# Patient Record
Sex: Female | Born: 1960 | Race: White | Hispanic: No | Marital: Single
Health system: Midwestern US, Community
[De-identification: ages and names within clinical notes are randomized; demographics above are authoritative.]

## PROBLEM LIST (undated history)

## (undated) DIAGNOSIS — Z1231 Encounter for screening mammogram for malignant neoplasm of breast: Secondary | ICD-10-CM

## (undated) DIAGNOSIS — R011 Cardiac murmur, unspecified: Secondary | ICD-10-CM

## (undated) DIAGNOSIS — F32A Depression, unspecified: Secondary | ICD-10-CM

## (undated) DIAGNOSIS — C8290 Follicular lymphoma, unspecified, unspecified site: Secondary | ICD-10-CM

## (undated) DIAGNOSIS — G8929 Other chronic pain: Secondary | ICD-10-CM

## (undated) DIAGNOSIS — F419 Anxiety disorder, unspecified: Secondary | ICD-10-CM

## (undated) DIAGNOSIS — M8700 Idiopathic aseptic necrosis of unspecified bone: Secondary | ICD-10-CM

## (undated) DIAGNOSIS — M1990 Unspecified osteoarthritis, unspecified site: Secondary | ICD-10-CM

## (undated) DIAGNOSIS — R112 Nausea with vomiting, unspecified: Secondary | ICD-10-CM

## (undated) DIAGNOSIS — Z9289 Personal history of other medical treatment: Secondary | ICD-10-CM

## (undated) DIAGNOSIS — K219 Gastro-esophageal reflux disease without esophagitis: Secondary | ICD-10-CM

## (undated) DIAGNOSIS — M549 Dorsalgia, unspecified: Secondary | ICD-10-CM

## (undated) DIAGNOSIS — Z9889 Other specified postprocedural states: Secondary | ICD-10-CM

## (undated) DIAGNOSIS — S82209A Unspecified fracture of shaft of unspecified tibia, initial encounter for closed fracture: Secondary | ICD-10-CM

## (undated) DIAGNOSIS — I10 Essential (primary) hypertension: Secondary | ICD-10-CM

## (undated) DIAGNOSIS — F329 Major depressive disorder, single episode, unspecified: Secondary | ICD-10-CM

## (undated) DIAGNOSIS — Z1239 Encounter for other screening for malignant neoplasm of breast: Secondary | ICD-10-CM

## (undated) DIAGNOSIS — R928 Other abnormal and inconclusive findings on diagnostic imaging of breast: Secondary | ICD-10-CM

## (undated) HISTORY — PX: LAPAROSCOPIC CHOLECYSTECTOMY: SUR755

## (undated) HISTORY — PX: LUMBAR DISC SURGERY: SHX700

## (undated) HISTORY — PX: ABDOMINAL HYSTERECTOMY: SHX81

## (undated) HISTORY — PX: TUBAL LIGATION: SHX77

## (undated) HISTORY — PX: FRACTURE SURGERY: SHX138

## (undated) HISTORY — PX: WISDOM TOOTH EXTRACTION: SHX21

## (undated) HISTORY — PX: COLONOSCOPY: SHX174

## (undated) HISTORY — PX: BACK SURGERY: SHX140

## (undated) HISTORY — PX: DILATION AND CURETTAGE OF UTERUS: SHX78

## (undated) HISTORY — PX: POSTERIOR LUMBAR FUSION: SHX6036

## (undated) HISTORY — PX: SPINAL CORD STIMULATOR IMPLANT: SHX2422

---

## 1998-09-27 HISTORY — PX: ANTERIOR CERVICAL DECOMP/DISCECTOMY FUSION: SHX1161

## 2009-04-18 LAB — HM MAMMOGRAPHY: Mammography, External: NORMAL

## 2011-01-20 LAB — HM PAP SMEAR: Pap Smear, External: NORMAL

## 2012-04-24 LAB — AMB POC URINALYSIS DIP STICK MANUAL W/O MICRO
Glucose (UA POC): NEGATIVE
Nitrites (UA POC): NEGATIVE
Protein (UA POC): NEGATIVE mg/dL

## 2012-04-24 MED ORDER — NITROFURANTOIN (25% MACROCRYSTAL FORM) 100 MG CAP
100 mg | ORAL_CAPSULE | Freq: Two times a day (BID) | ORAL | Status: AC
Start: 2012-04-24 — End: 2012-05-01

## 2012-04-24 NOTE — Progress Notes (Signed)
Subjective:   51 y.o. female for annual routine Pap and checkup.  Patient's last menstrual period was 03/27/2012.    Social History: not sexually active.  Pertinent past medical hstory: no history of  DVT, CAD, DM, liver disease, migraines or smoking.    Patient Active Problem List   Diagnoses Code   ??? UTI (lower urinary tract infection) 599.0   ??? Routine gynecological examination V72.31   ??? Bulky or enlarged uterus 621.2     Patient Active Problem List   Diagnoses Date Noted   ??? UTI (lower urinary tract infection) 04/24/2012   ??? Routine gynecological examination 04/24/2012   ??? Bulky or enlarged uterus 04/24/2012     Current Outpatient Prescriptions   Medication Sig Dispense Refill   ??? multivitamin with iron (DAILY MULTI-VITAMINS/IRON) tablet Take 1 Tab by mouth daily.       ??? nitrofurantoin, macrocrystal-monohydrate, (MACROBID) 100 mg capsule Take 1 Cap by mouth two (2) times a day for 7 days.  14 Cap  0     Allergies   Allergen Reactions   ??? Sulfa (Sulfonamide Antibiotics) Unable to Obtain     Past Medical History   Diagnosis Date   ??? Hypertension    ??? Lethargy    ??? Fibrocystic breast    ??? Endocervical polyp 2001     Colposcopy/ Bx. benign   ??? Incontinence      Mild   ??? Enlarged uterus      History reviewed. No pertinent past surgical history.  Family History   Problem Relation Age of Onset   ??? Psychiatric Disorder Mother      alzheimer   ??? Depression Mother    ??? Thyroid Disease Mother    ??? Other Mother      hiatal hernia   ??? Cancer Father      Brain   ??? Heart Disease Maternal Uncle    ??? Heart Disease Maternal Grandfather    ??? Other Paternal Grandmother      gall bladder    ??? Alcohol abuse Neg Hx    ??? Arthritis-osteo Neg Hx    ??? Asthma Neg Hx    ??? Bleeding Prob Neg Hx    ??? Diabetes Neg Hx    ??? Elevated Lipids Neg Hx    ??? Headache Neg Hx    ??? Hypertension Neg Hx    ??? Lung Disease Neg Hx    ??? Migraines Neg Hx    ??? Stroke Neg Hx    ??? Mental Retardation Neg Hx      History   Substance Use Topics   ??? Smoking status:  Never Smoker    ??? Smokeless tobacco: Never Used   ??? Alcohol Use: 0.5 oz/week     1 drink(s) per week      Occas.         ROS:  Feeling well. No dyspnea or chest pain on exertion.  No abdominal pain, change in bowel habits, black or bloody stools.  No urinary tract symptoms. GYN ROS: normal menses, no abnormal bleeding, pelvic pain or discharge, no breast pain or new or enlarging lumps on self exam. No neurological complaints.    Objective:   BP 135/71   Pulse 74   Ht 5\' 5"  (1.651 m)   Wt 177 lb (80.287 kg)   BMI 29.45 kg/m2   LMP 03/27/2012  The patient appears well, alert, oriented x 3, in no distress.  ENT normal.  Neck supple. No adenopathy  or thyromegaly. PERLA. Lungs are clear, good air entry, no wheezes, rhonchi or rales. S1 and S2 normal, no murmurs, regular rate and rhythm. Abdomen soft without tenderness, guarding, mass or organomegaly. Extremities show no edema, normal peripheral pulses. Neurological is normal, no focal findings.    BREAST EXAM: breasts appear normal, no suspicious masses, no skin or nipple changes or axillary nodes    PELVIC EXAM: VULVA: normal appearing vulva with no masses, tenderness or lesions, VAGINA: normal appearing vagina with normal color and discharge, no lesions, CERVIX: normal appearing cervix without discharge or lesions, cervical motion tenderness present, UTERUS: enlarged to 12 week's size, irregular, ADNEXA: normal adnexa in size, nontender and no masses, RECTAL: normal rectal, no masses, PAP: Pap smear done today    Assessment/Plan:   well woman exam completed  mammogram  pap smear  additional lab tests per orders  1. Routine gynecological examination  AMB POC URINALYSIS DIP STICK MANUAL W/O MICRO, PAP LB, RFX HPV ALL AVW([id number])   2. UTI (lower urinary tract infection)  nitrofurantoin, macrocrystal-monohydrate, (MACROBID) 100 mg capsule     Encounter Diagnoses   Name Primary?   ??? Routine gynecological examination Yes   ??? UTI (lower urinary tract infection)      Orders  Placed This Encounter   ??? AMB POC URINALYSIS DIP STICK MANUAL W/O MICRO   ??? multivitamin with iron (DAILY MULTI-VITAMINS/IRON) tablet   ??? nitrofurantoin, macrocrystal-monohydrate, (MACROBID) 100 mg capsule   ??? PAP LB, RFX HPV ALL JYN([id number])   .

## 2012-04-24 NOTE — Patient Instructions (Signed)
Uterine Fibroids: After Your Visit  Your Care Instructions  Uterine fibroids are growths in the uterus. Fibroids aren't cancer. Doctors don't know what causes fibroids. Fibroids are very common in women during their childbearing years.  Fibroids can grow on the inside of the uterus, in the muscle wall of the uterus, or near the outside wall of the uterus. In some women, fibroids cause painful cramps and heavy periods. In these cases, taking anti-inflammatory medicines and birth control pills often helps decrease symptoms. Sometimes surgery is needed to treat fibroids. But if you are near menopause, you may want to wait and see if your symptoms get better.  Most fibroids shrink and go away after menopause, when your menstrual periods stop completely.  Follow-up care is a key part of your treatment and safety. Be sure to make and go to all appointments, and call your doctor if you are having problems. It???s also a good idea to know your test results and keep a list of the medicines you take.  How can you care for yourself at home?  ?? If your doctor gave you medicine, take it as exactly as prescribed. Call your doctor if you think you are having a problem with your medicine.   ?? Take anti-inflammatory medicines for pain. These include ibuprofen (Advil, Motrin) and naproxen (Aleve). Read and follow all instructions on the label.   ?? Use heat, such as a hot water bottle or a heating pad set on low, or a warm bath to relax tense muscles and relieve cramping. Put a thin cloth between the heating pad and your skin. Never go to sleep with a heating pad on.   ?? Lie down and put a pillow under your knees. Or, lie on your side and bring your knees up to your chest. These positions may help relieve belly pain or pressure.   ?? Use sanitary pads instead of tampons. Keep track of how many you use each day.   ?? Get at least 30 minutes of exercise on most days of the week. Walking is a good choice. You also may want to do other  activities, such as running, swimming, cycling, or playing tennis or team sports.   ?? If you bleed for several days or have heavy bleeding, take a daily multivitamin with iron.   When should you call for help?  Call 911 anytime you think you may need emergency care. For example, call if:  ?? You passed out (lost consciousness).   ?? You have sudden, severe pain in your belly or pelvis.   Call your doctor now or seek immediate medical care if:  ?? You have severe vaginal bleeding. You are passing clots of blood and soaking through your usual pads every hour for 2 or more hours.   ?? You have new belly or pelvic pain.   ?? You are dizzy or lightheaded, or you feel like you may faint.   ?? You have new or unexpected vaginal bleeding.   Watch closely for changes in your health, and be sure to contact your doctor if:  ?? You have new vaginal discharge.   ?? You have ongoing heavy or irregular vaginal bleeding.   ?? You have pelvic pain or a heavy feeling in your lower belly that doesn't go away.   ?? You have to urinate often.   ?? You are more constipated than usual.     Where can you learn more?    Go to http://www.healthwise.net/[facility]     Enter B121 in the search box to learn more about "Uterine Fibroids: After Your Visit."    ?? 2006-2013 Healthwise, Incorporated. Care instructions adapted under license by [place] (which disclaims liability or warranty for this information). This care instruction is for use with your licensed healthcare professional. If you have questions about a medical condition or this instruction, always ask your healthcare professional. Healthwise, Incorporated disclaims any warranty or liability for your use of this information.  Content Version: 9.7.130178; Last Revised: April 01, 2010

## 2012-04-29 LAB — PAP LB, RFX HPV ALL PTH(507301)
.: 0
LABCORP 019018: 0

## 2012-04-29 LAB — HPV HIGH RISK, THIN PREP
HPV DNA Probe, High Risk: POSITIVE — AB
HPV, High Risk: POSITIVE — AB

## 2012-05-01 NOTE — Progress Notes (Signed)
Quick Note:    Pt needs to be asked to come in for discussion of results.  ______

## 2012-05-02 NOTE — Progress Notes (Signed)
Pt has appt on 05/10/12.  Dr will discuss results with her at this time

## 2012-05-09 ENCOUNTER — Encounter

## 2012-05-10 NOTE — Progress Notes (Signed)
Subjective:      Pamela Hale is a 51 y.o. female seen for evaluation of abnormal Pap smear.    Most recent Pap smear 2 weeks ago result: Mildly abnormal (LGSIL - encompassing HPV,mild dysplasia,CIN I).  Prior Pap result: Normal.  Prior cervical/vaginal disease: CIN 1.   Prior cervical treatment: no treatment.    Was noted to have an enlarged bulky uterus at last exam. Fibroid was confirmed by u/s exam subsequently.    Marital status: single  Sexual history: not sexually active.  Cervical cancer risk factors: prior abnormal Pap smears    Patient Active Problem List   Diagnoses Code   ??? UTI (lower urinary tract infection) 599.0   ??? Bulky or enlarged uterus 621.2   ??? 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 LSIL (low grade squamous intraepithelial lesion) on Pap smear 05/10/2012   ??? UTI (lower urinary tract infection) 04/24/2012   ??? Bulky or enlarged uterus 04/24/2012     Current Outpatient Prescriptions   Medication Sig Dispense Refill   ??? multivitamin with iron (DAILY MULTI-VITAMINS/IRON) tablet Take 1 Tab by mouth daily.         Allergies   Allergen Reactions   ??? Sulfa (Sulfonamide Antibiotics) Unable to Obtain     Past Medical History   Diagnosis Date   ??? Lethargy    ??? Fibrocystic breast    ??? Endocervical polyp 2001     Colposcopy/ Bx. benign   ??? Incontinence      Mild   ??? Enlarged uterus    ??? Hypertension      History reviewed. No pertinent past surgical history.  Family History   Problem Relation Age of Onset   ??? Psychiatric Disorder Mother      alzheimer   ??? Depression Mother    ??? Thyroid Disease Mother    ??? Other Mother      hiatal hernia   ??? Cancer Father      Brain   ??? Heart Disease Maternal Uncle    ??? Heart Disease Maternal Grandfather    ??? Other Paternal Grandmother      gall bladder    ??? Alcohol abuse Neg Hx    ??? Arthritis-osteo Neg Hx    ??? Asthma Neg Hx    ??? Bleeding Prob Neg Hx    ??? Diabetes Neg Hx    ??? Elevated  Lipids Neg Hx    ??? Headache Neg Hx    ??? Hypertension Neg Hx    ??? Lung Disease Neg Hx    ??? Migraines Neg Hx    ??? Stroke Neg Hx    ??? Mental Retardation Neg Hx      History   Substance Use Topics   ??? Smoking status: Never Smoker    ??? Smokeless tobacco: Never Used   ??? Alcohol Use: 0.5 oz/week     1 drink(s) per week      Occas.       Review of Systems    Pertinent items are noted in HPI.     Objective:     BP 151/79   Pulse 82   Ht 5\' 5"  (1.651 m)   Wt 176 lb (79.833 kg)   BMI 29.29 kg/m2   LMP 04/25/2012   Physical Exam:   General appearance - alert, well appearing, and in no distress  Mental status -  alert, oriented to person, place, and time, normal mood, behavior, speech, dress, motor activity, and thought processes  Neurological - alert, oriented, normal speech, no focal findings or movement disorder noted     Abnormal pap result: was explained to pt at length today. Need for colposcopy/Bx stressed.    95/100  4/100        1/100      / Less than 1/100 Less than  Normal ASCUS   Low Grade / High Grade    09/998             LSIL    HSIL      Normal  Atypical   Squamous   Cancer  Pap Smear Squamous  Intraepithelial    Cells of  Lesion    Undetermined    Significance      Ultrasound report: was also explained to pt. She has had problems with prolonged and heavy periods. Options discussed with pt today.    Assessment/Plan:     LSIL pap                           Colposcopy will be scheduled as soon as possible    Enlarged / Fibroid uterus: Evaluation and treatment options discussed with pt.

## 2012-05-10 NOTE — Patient Instructions (Signed)
Colposcopy: Before Your Procedure  What is a colposcopy?  Colposcopy is a way for your doctor to look closely at your vulva, vagina, and cervix to look for any problems. If the doctor sees an area of concern, he or she can take a small sample of tissue. This is called a biopsy. The sample is looked at under a microscope.  Colposcopy is usually done when the result of a Pap test is abnormal.  During the test, your doctor will insert a lubricated tool called a speculum into your vagina. The speculum gently spreads apart the sides of your vagina. This allows your doctor to see inside your vagina and the cervix. Then the doctor will use a magnifying device to see into your vagina and cervix. The device does not go inside your vagina.  The doctor may put a vinegar or iodine solution on your cervix to make abnormal areas more visible. He or she may take photos or videos of your vagina and cervix.  You may feel some discomfort when the speculum is inserted. You may feel a pinch and have some cramping if the doctor takes a biopsy sample.  Follow-up care is a key part of your treatment and safety. Be sure to make and go to all appointments, and call your doctor if you are having problems. It's also a good idea to know your test results and keep a list of the medicines you take.  What happens before the procedure?  Having a procedure can be stressful. This information will help you understand what you can expect and how to safely prepare for your procedure.  Preparing for the procedure  ?? Tell your doctor if:   ?? You are having your menstrual period. This test usually is not done during your period, because blood cells make it harder for your doctor to see your cervix.   ?? You are or might be pregnant. A blood or urine test may be done to see if you are pregnant. Colposcopy is safe during pregnancy. The chance of miscarriage is very small. But you may have some bleeding from a biopsy.   ?? You are allergic to any medicines.    ?? You have had bleeding problems or take blood thinners, such as warfarin (Coumadin), clopidogrel (Plavix), or aspirin.   ?? You have been treated for a vaginal, cervical, or pelvic infection.   ?? Do not douche, use tampons, have sexual intercourse, or use vaginal medicines for 24 hours before the test.   ?? Bring a list of questions to ask your doctors. It is important that you understand exactly what procedure is planned, the risks, benefits, and other options before your procedure.   ?? Tell your doctors ALL the medicines, vitamins, supplements, or herbal remedies you are taking. Keep a list of these with you, and bring this with you to every appointment. You will be told which medicine to take or to stop before your procedure.   Taking care of yourself before the procedure  ?? Build healthy habits into your life. Changes are best made several weeks before the procedure, since your body may react to sudden changes in your habits.   ?? Stay as active as you can.   ?? Eat a healthy diet.   ?? Cut back or quit alcohol and tobacco.   What happens on the day of the procedure?  ?? You may want to take a pain reliever, such as ibuprofen (Advil or Motrin), 30 to 60 minutes before   you have the test. This can help reduce any cramping pain from a biopsy.   ?? Leave your valuables at home.   At the doctor's office  ?? Bring a picture ID.   ?? The procedure will last about 15 to 30 minutes.   ?? Afterward, you may be given a pad in case you have any bleeding.   Going home  ?? You will be given more specific instructions about recovering from your procedure, including activity and when you may return to work.   When should you call your doctor?  ?? You have questions or concerns.   ?? You don't understand how to prepare for your procedure.   ?? You become ill before the procedure (such as fever, flu, or a cold).   ?? You need to reschedule or have changed your mind about having the procedure.     Where can you learn more?    Go to  http://www.healthwise.net/[facility]   Enter N867 in the search box to learn more about "Colposcopy: Before Your Procedure."    ?? 2006-2013 Healthwise, Incorporated. Care instructions adapted under license by [place] (which disclaims liability or warranty for this information). This care instruction is for use with your licensed healthcare professional. If you have questions about a medical condition or this instruction, always ask your healthcare professional. Healthwise, Incorporated disclaims any warranty or liability for your use of this information.  Content Version: 9.7.130178; Last Revised: July 19, 2011

## 2012-05-17 NOTE — Progress Notes (Signed)
Patient here for a colposcopic exam because of LSIL pap smear.  Procedure has been explained and discussed with patient at length using diagrams and models.   Potential risks and complications of the procedure have been conveyed to the patient verbally and in writing.   Patient's questions have been answered to her satisfaction and a consent for the procedure obtained.    Procedure:    Patient asked to assume a lithotomy position. Speculum inserted and the cervix visualized.     Colposcopic exam carried out before and after application of acetic acid.   Transformation zone is not visible.  Cervix is  not friable   White epithelium seen at 12 O'Clock on the portio vaginalis of the cervix - biopsied  Mosaicism  not seen   Punctation  not seen   Abnormal blood vessels  not seen    ECC  obtained  Biopsies taken    Bleeding secured with silver nitrate sticks    A: Unsatisfactory colposcopic exam       P: ECC  obtained   Biopsies taken from the portio vaginalis of the cervix

## 2012-05-17 NOTE — Patient Instructions (Signed)
Colposcopy: Before Your Procedure  What is a colposcopy?  Colposcopy is a way for your doctor to look closely at your vulva, vagina, and cervix to look for any problems. If the doctor sees an area of concern, he or she can take a small sample of tissue. This is called a biopsy. The sample is looked at under a microscope.  Colposcopy is usually done when the result of a Pap test is abnormal.  During the test, your doctor will insert a lubricated tool called a speculum into your vagina. The speculum gently spreads apart the sides of your vagina. This allows your doctor to see inside your vagina and the cervix. Then the doctor will use a magnifying device to see into your vagina and cervix. The device does not go inside your vagina.  The doctor may put a vinegar or iodine solution on your cervix to make abnormal areas more visible. He or she may take photos or videos of your vagina and cervix.  You may feel some discomfort when the speculum is inserted. You may feel a pinch and have some cramping if the doctor takes a biopsy sample.  Follow-up care is a key part of your treatment and safety. Be sure to make and go to all appointments, and call your doctor if you are having problems. It's also a good idea to know your test results and keep a list of the medicines you take.  What happens before the procedure?  Having a procedure can be stressful. This information will help you understand what you can expect and how to safely prepare for your procedure.  Preparing for the procedure  ?? Tell your doctor if:   ?? You are having your menstrual period. This test usually is not done during your period, because blood cells make it harder for your doctor to see your cervix.   ?? You are or might be pregnant. A blood or urine test may be done to see if you are pregnant. Colposcopy is safe during pregnancy. The chance of miscarriage is very small. But you may have some bleeding from a biopsy.   ?? You are allergic to any medicines.    ?? You have had bleeding problems or take blood thinners, such as warfarin (Coumadin), clopidogrel (Plavix), or aspirin.   ?? You have been treated for a vaginal, cervical, or pelvic infection.   ?? Do not douche, use tampons, have sexual intercourse, or use vaginal medicines for 24 hours before the test.   ?? Bring a list of questions to ask your doctors. It is important that you understand exactly what procedure is planned, the risks, benefits, and other options before your procedure.   ?? Tell your doctors ALL the medicines, vitamins, supplements, or herbal remedies you are taking. Keep a list of these with you, and bring this with you to every appointment. You will be told which medicine to take or to stop before your procedure.   Taking care of yourself before the procedure  ?? Build healthy habits into your life. Changes are best made several weeks before the procedure, since your body may react to sudden changes in your habits.   ?? Stay as active as you can.   ?? Eat a healthy diet.   ?? Cut back or quit alcohol and tobacco.   What happens on the day of the procedure?  ?? You may want to take a pain reliever, such as ibuprofen (Advil or Motrin), 30 to 60 minutes before   you have the test. This can help reduce any cramping pain from a biopsy.   ?? Leave your valuables at home.   At the doctor's office  ?? Bring a picture ID.   ?? The procedure will last about 15 to 30 minutes.   ?? Afterward, you may be given a pad in case you have any bleeding.   Going home  ?? You will be given more specific instructions about recovering from your procedure, including activity and when you may return to work.   When should you call your doctor?  ?? You have questions or concerns.   ?? You don't understand how to prepare for your procedure.   ?? You become ill before the procedure (such as fever, flu, or a cold).   ?? You need to reschedule or have changed your mind about having the procedure.     Where can you learn more?    Go to  http://www.healthwise.net/[facility]   Enter N867 in the search box to learn more about "Colposcopy: Before Your Procedure."    ?? 2006-2013 Healthwise, Incorporated. Care instructions adapted under license by [place] (which disclaims liability or warranty for this information). This care instruction is for use with your licensed healthcare professional. If you have questions about a medical condition or this instruction, always ask your healthcare professional. Health[employer], Inc[employer] disclaims any warranty or liability for your use of this information.  Content Version: 9.7.130178; Last Revised: July 19, 2011

## 2012-05-19 LAB — HISTOPATHOLOGY

## 2013-06-05 NOTE — Telephone Encounter (Signed)
Pt is due for mmg, unable to reach patient to schedule, # disconnected, pt is also due for gyn exam

## 2013-06-06 ENCOUNTER — Encounter

## 2014-08-26 ENCOUNTER — Encounter

## 2014-11-01 ENCOUNTER — Ambulatory Visit: Payer: BLUE CROSS/BLUE SHIELD | Primary: Family

## 2014-12-20 ENCOUNTER — Inpatient Hospital Stay: Admit: 2014-12-20 | Payer: BLUE CROSS/BLUE SHIELD | Attending: Obstetrics & Gynecology | Primary: Family

## 2014-12-20 DIAGNOSIS — Z1231 Encounter for screening mammogram for malignant neoplasm of breast: Secondary | ICD-10-CM

## 2016-01-20 ENCOUNTER — Encounter

## 2016-03-01 ENCOUNTER — Telehealth: Payer: Self-pay | Admitting: Hematology

## 2016-03-01 NOTE — Telephone Encounter (Signed)
Verified address and insurance, spoke with referring office regarding appt date/time and will contact pt, lt mess for pt regarding new pt referral.

## 2016-03-03 ENCOUNTER — Other Ambulatory Visit: Payer: Medicare HMO

## 2016-03-03 ENCOUNTER — Telehealth: Payer: Self-pay | Admitting: Hematology

## 2016-03-03 ENCOUNTER — Ambulatory Visit (HOSPITAL_BASED_OUTPATIENT_CLINIC_OR_DEPARTMENT_OTHER): Payer: Medicare HMO | Admitting: Hematology

## 2016-03-03 ENCOUNTER — Ambulatory Visit (HOSPITAL_BASED_OUTPATIENT_CLINIC_OR_DEPARTMENT_OTHER): Payer: Medicare HMO

## 2016-03-03 VITALS — BP 152/64 | HR 72 | Temp 98.0°F | Resp 18 | Ht 66.0 in | Wt 182.9 lb

## 2016-03-03 DIAGNOSIS — C8299 Follicular lymphoma, unspecified, extranodal and solid organ sites: Secondary | ICD-10-CM | POA: Diagnosis not present

## 2016-03-03 DIAGNOSIS — I10 Essential (primary) hypertension: Secondary | ICD-10-CM | POA: Diagnosis not present

## 2016-03-03 DIAGNOSIS — C8290 Follicular lymphoma, unspecified, unspecified site: Secondary | ICD-10-CM

## 2016-03-03 LAB — COMPREHENSIVE METABOLIC PANEL
ALBUMIN: 4 g/dL (ref 3.5–5.0)
ALK PHOS: 105 U/L (ref 40–150)
ALT: 25 U/L (ref 0–55)
ANION GAP: 6 meq/L (ref 3–11)
AST: 21 U/L (ref 5–34)
BILIRUBIN TOTAL: 0.36 mg/dL (ref 0.20–1.20)
BUN: 19 mg/dL (ref 7.0–26.0)
CALCIUM: 9.5 mg/dL (ref 8.4–10.4)
CO2: 30 mEq/L — ABNORMAL HIGH (ref 22–29)
Chloride: 105 mEq/L (ref 98–109)
Creatinine: 0.8 mg/dL (ref 0.6–1.1)
EGFR: 80 mL/min/{1.73_m2} — AB (ref >90)
GLUCOSE: 96 mg/dL (ref 70–140)
POTASSIUM: 3.8 meq/L (ref 3.5–5.1)
SODIUM: 140 meq/L (ref 136–145)
TOTAL PROTEIN: 7.4 g/dL (ref 6.4–8.3)

## 2016-03-03 LAB — LACTATE DEHYDROGENASE: LDH: 178 U/L (ref 125–245)

## 2016-03-03 LAB — CBC & DIFF AND RETIC
BASO%: 0.5 % (ref 0.0–2.0)
Basophils Absolute: 0 10*3/uL (ref 0.0–0.1)
EOS ABS: 0.2 10*3/uL (ref 0.0–0.5)
EOS%: 2.9 % (ref 0.0–7.0)
HCT: 38 % (ref 34.8–46.6)
HEMOGLOBIN: 12.8 g/dL (ref 11.6–15.9)
Immature Retic Fract: 7.5 % (ref 1.60–10.00)
LYMPH%: 29.4 % (ref 14.0–49.7)
MCH: 30 pg (ref 25.1–34.0)
MCHC: 33.7 g/dL (ref 31.5–36.0)
MCV: 89 fL (ref 79.5–101.0)
MONO#: 0.4 10*3/uL (ref 0.1–0.9)
MONO%: 7.2 % (ref 0.0–14.0)
NEUT%: 60 % (ref 38.4–76.8)
NEUTROS ABS: 3.7 10*3/uL (ref 1.5–6.5)
PLATELETS: 207 10*3/uL (ref 145–400)
RBC: 4.27 10*6/uL (ref 3.70–5.45)
RDW: 12.8 % (ref 11.2–14.5)
Retic %: 1.51 % (ref 0.70–2.10)
Retic Ct Abs: 64.48 10*3/uL (ref 33.70–90.70)
WBC: 6.1 10*3/uL (ref 3.9–10.3)
lymph#: 1.8 10*3/uL (ref 0.9–3.3)

## 2016-03-03 NOTE — Telephone Encounter (Signed)
per pof to sch pt appt-pt sent back to lab-gave copy of avs

## 2016-03-03 NOTE — Progress Notes (Signed)
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  HISTORY OF PRESENTING ILLNESS:   Priscilla Higgins is a wonderful 55 y.o. female who has been referred to [place] by Dr Emelda Fear, DO  for evaluation and management of newly diagnosed follicular lymphoma.  Patient has a history of hypertension, depression/anxiety, degenerative disc disease with multiple spinal surgeries and chronic pain who reports having had a scaly somewhat pruritic lesion in front of her right ear. She reports this has been present for several months and she eventually saw a dermatologist who biopsied this. Pathology revealed presence of follicular lymphoma.  Patient also reports having a swelling over her left fore head for several months as well. This has not been biopsied. Patient reports perimenopausal symptoms including hot flashes but does not report significant night sweats or weight loss or new fatigue.  She does not reveal any palpable enlarged lymph nodes or other overt new focal symptoms. No shortness of breath or chest pain. No abdominal pain or significant distention.   MEDICAL HISTORY:   #1 anxiety #2 degenerative disc disease with multiple back surgeries and chronic pain  #3 presence of spinal stimulator #4 hypertension #5 depression/anxiety #6 perimenopausal symptoms #7History of cervical cancer status post total abdominal hysterectomy and bilateral salpingo-oophorectomy at age 9 yrs.  SURGICAL HISTORY:  Back surgery 5 times Neck surgery 1 fusion. Dorsal column stimulator. Cholecystectomy Bladder lift 2 History of cervical cancer status post total abdominal hysterectomy and bilateral salpingo-oophorectomy at age 34  SOCIAL HISTORY: Social History   Social History  . Marital Status: Married    Spouse  Name: N/A  . Number of Children: N/A  . Years of Education: N/A   Occupational History  . Not on file.   Social History Main Topics  . Smoking status: Not on file  . Smokeless tobacco: Not on file  . Alcohol Use: Not on file  . Drug Use: Not on file  . Sexual Activity: Not on file   Other Topics Concern  . Not on file   Social History Narrative  . No narrative on file  Lifelong nonsmoker Minimal alcohol use  FAMILY HISTORY:  No family history of any cancers or known blood disorders.  ALLERGIES:  is allergic to latex; other; sulfa antibiotics; and tape.  MEDICATIONS:  Current Outpatient Prescriptions  Medication Sig Dispense Refill  . ALPRAZolam (XANAX) 0.5 MG tablet     . amitriptyline (ELAVIL) 25 MG tablet Take 25 mg by mouth at bedtime.  5  . citalopram (CELEXA) 20 MG tablet Take 20 mg by mouth daily.    Marland Kitchen estradiol (VIVELLE-DOT) 0.0375 MG/24HR APPLY 1 PATCH TO SKIN TWICE A WEEK AS DIRECTED  5  . HYDROcodone-acetaminophen (NORCO) 10-325 MG tablet Take 1 tablet by mouth 3 (three) times daily.  0  . linaclotide (LINZESS) 290 MCG CAPS capsule Take 290 mcg by mouth daily before breakfast.    . losartan-hydrochlorothiazide (HYZAAR) 100-25 MG tablet Take 1 tablet by mouth daily.  4  . venlafaxine (EFFEXOR) 75 MG tablet     . Vitamin D, Cholecalciferol, 1000 units CAPS Take 5,000 Units/day by mouth.    . hydrochlorothiazide (HYDRODIURIL) 50 MG tablet Reported on 03/03/2016     No current facility-administered medications for this visit.    REVIEW OF SYSTEMS:    10 Point review of  Systems was done is negative except as noted above.  PHYSICAL EXAMINATION: ECOG PERFORMANCE STATUS: 1 - Symptomatic but completely ambulatory  . Filed Vitals:   03/03/16 1010  BP: 152/64  Pulse: 72  Temp: 98 F (36.7 C)  Resp: 18   Filed Weights   03/03/16 1010  Weight: 182 lb 14.4 oz (82.963 kg)   .Body mass index is 29.53 kg/(m^2).  GENERAL:alert, in no acute distress and  comfortable SKIN: Somewhat scaly lesion in the front of her right pinna. Subcutaneous swelling over the left fore head. EYES: normal, conjunctiva are pink and non-injected, sclera clear OROPHARYNX:no exudate, no erythema and lips, buccal mucosa, and tongue normal  NECK: supple, no JVD, thyroid normal size, non-tender, without nodularity LYMPH:  no palpable lymphadenopathy in the cervical, axillary or inguinal LUNGS: clear to auscultation with normal respiratory effort HEART: regular rate & rhythm,  no murmurs and no lower extremity edema ABDOMEN: abdomen soft, non-tender, normoactive bowel sounds , no palpable hepatosplenomegaly. Musculoskeletal: no cyanosis of digits and no clubbing  PSYCH: alert & oriented x 3 with fluent speech NEURO: no focal motor/sensory deficits  LABORATORY DATA:  I have reviewed the data as listed  . CBC Latest Ref Rng 03/03/2016  WBC 3.9 - 10.3 10e3/uL 6.1  Hemoglobin 11.6 - 15.9 g/dL 12.8  Hematocrit 34.8 - 46.6 % 38.0  Platelets 145 - 400 10e3/uL 207    . CMP Latest Ref Rng 03/03/2016  Glucose 70 - 140 mg/dl 96  BUN 7.0 - 26.0 mg/dL 19.0  Creatinine 0.6 - 1.1 mg/dL 0.8  Sodium 136 - 145 mEq/L 140  Potassium 3.5 - 5.1 mEq/L 3.8  CO2 22 - 29 mEq/L 30(H)  Calcium 8.4 - 10.4 mg/dL 9.5  Total Protein 6.4 - 8.3 g/dL 7.4  Total Bilirubin 0.20 - 1.20 mg/dL 0.36  Alkaline Phos 40 - 150 U/L 105  AST 5 - 34 U/L 21  ALT 0 - 55 U/L 25    . Lab Results  Component Value Date   LDH 178 03/03/2016        RADIOGRAPHIC STUDIES: I have personally reviewed the radiological images as listed and agreed with the findings in the report. No results found.  ASSESSMENT & PLAN:  55 year old female with  1) newly diagnosed follicular lymphoma involving the skin in the right preauricular area. Also likely involvement over the left fore head. No obvious palpable lymphadenopathy or hepatosplenomegaly. Blood counts are stable. LDH levels within normal limits No  overt constitutional symptoms at this time. I suspect that this likely represents a primary cutaneous follicular lymphoma but I cannot rule out the possibility of a systemic follicular lymphoma with skin involvement. Plan -The pathology findings, diagnosis, possibilities of prognosis and also will treatment options pending staging workup as the patient and her husband and all their questions were answered in details. -PET/CT scan including the head and face for initial staging of her follicular lymphoma and to study the left fore head mass. -If the patient has no evidence of systemic follicular lymphoma treatments would involve local therapies especially local skin radiation. -Might need to biopsy and confirm the pathology associated with the left fore head lesion prior to local treatment. -If the patient has signs of systemic follicular lymphoma would likely need systemic therapies and not just local treatments. -Will consult radiation oncology according to the findings of her PET/CT scan.  Return to care in 2 weeks after PET CT scan done.  All of the patients and her husbands questions were  answered to their apparent satisfaction. The patient knows to call the clinic with any problems, questions or concerns.  I spent 60 minutes counseling the patient face to face. The total time spent in the appointment was 60 minutes and more than 50% was on counseling and direct patient cares.    Sullivan Lone MD [place] [facility]IVMS [facility] Hematology/Oncology Physician [facility]  (Office):       [phone] (Work cell):  [phone] (Fax):           [phone]  03/03/2016 10:29 AM

## 2016-03-15 ENCOUNTER — Encounter (HOSPITAL_COMMUNITY): Payer: Self-pay | Admitting: Radiology

## 2016-03-15 ENCOUNTER — Ambulatory Visit (HOSPITAL_COMMUNITY)
Admission: RE | Admit: 2016-03-15 | Discharge: 2016-03-15 | Disposition: A | Discharge status: Home / Self Care | Payer: Medicare HMO | Source: Ambulatory Visit | Attending: Hematology | Admitting: Hematology

## 2016-03-15 DIAGNOSIS — Z90710 Acquired absence of both cervix and uterus: Secondary | ICD-10-CM | POA: Diagnosis not present

## 2016-03-15 DIAGNOSIS — R938 Abnormal findings on diagnostic imaging of other specified body structures: Secondary | ICD-10-CM | POA: Insufficient documentation

## 2016-03-15 DIAGNOSIS — C8290 Follicular lymphoma, unspecified, unspecified site: Secondary | ICD-10-CM | POA: Insufficient documentation

## 2016-03-15 LAB — GLUCOSE, CAPILLARY: GLUCOSE-CAPILLARY: 99 mg/dL (ref 65–99)

## 2016-03-15 MED ORDER — FLUDEOXYGLUCOSE F - 18 (FDG) INJECTION
9.0600 | Freq: Once | INTRAVENOUS | Status: AC | PRN
  Administered 2016-03-15: 9.06 via INTRAVENOUS

## 2016-03-16 ENCOUNTER — Inpatient Hospital Stay: Admit: 2016-03-16 | Payer: BLUE CROSS/BLUE SHIELD | Attending: Obstetrics & Gynecology | Primary: Family

## 2016-03-16 ENCOUNTER — Ambulatory Visit

## 2016-03-16 DIAGNOSIS — Z1231 Encounter for screening mammogram for malignant neoplasm of breast: Secondary | ICD-10-CM

## 2016-03-17 ENCOUNTER — Encounter (HOSPITAL_COMMUNITY): Payer: Self-pay | Admitting: Emergency Medicine

## 2016-03-17 ENCOUNTER — Emergency Department (HOSPITAL_COMMUNITY): Payer: Medicare HMO

## 2016-03-17 ENCOUNTER — Ambulatory Visit (HOSPITAL_BASED_OUTPATIENT_CLINIC_OR_DEPARTMENT_OTHER): Payer: Medicare HMO | Admitting: Hematology

## 2016-03-17 ENCOUNTER — Inpatient Hospital Stay (HOSPITAL_COMMUNITY)
Admission: EM | Admit: 2016-03-17 | Discharge: 2016-03-20 | DRG: 563 | Disposition: A | Discharge status: Home Health | Payer: Medicare HMO | Attending: Orthopaedic Surgery | Admitting: Orthopaedic Surgery

## 2016-03-17 ENCOUNTER — Telehealth: Payer: Self-pay | Admitting: Hematology

## 2016-03-17 ENCOUNTER — Other Ambulatory Visit: Payer: Self-pay

## 2016-03-17 ENCOUNTER — Other Ambulatory Visit (HOSPITAL_COMMUNITY): Payer: Self-pay

## 2016-03-17 ENCOUNTER — Encounter: Payer: Self-pay | Admitting: Hematology

## 2016-03-17 VITALS — BP 155/92 | HR 68 | Temp 98.0°F | Resp 16 | Wt 185.0 lb

## 2016-03-17 DIAGNOSIS — S93314A Dislocation of tarsal joint of right foot, initial encounter: Secondary | ICD-10-CM | POA: Diagnosis present

## 2016-03-17 DIAGNOSIS — I10 Essential (primary) hypertension: Secondary | ICD-10-CM | POA: Diagnosis present

## 2016-03-17 DIAGNOSIS — F329 Major depressive disorder, single episode, unspecified: Secondary | ICD-10-CM | POA: Diagnosis present

## 2016-03-17 DIAGNOSIS — S82209A Unspecified fracture of shaft of unspecified tibia, initial encounter for closed fracture: Secondary | ICD-10-CM

## 2016-03-17 DIAGNOSIS — S82899A Other fracture of unspecified lower leg, initial encounter for closed fracture: Secondary | ICD-10-CM

## 2016-03-17 DIAGNOSIS — Z981 Arthrodesis status: Secondary | ICD-10-CM

## 2016-03-17 DIAGNOSIS — Q738 Other reduction defects of unspecified limb(s): Secondary | ICD-10-CM

## 2016-03-17 DIAGNOSIS — Z9889 Other specified postprocedural states: Secondary | ICD-10-CM

## 2016-03-17 DIAGNOSIS — C8290 Follicular lymphoma, unspecified, unspecified site: Secondary | ICD-10-CM

## 2016-03-17 DIAGNOSIS — T148XXA Other injury of unspecified body region, initial encounter: Secondary | ICD-10-CM

## 2016-03-17 DIAGNOSIS — IMO0001 Reserved for inherently not codable concepts without codable children: Secondary | ICD-10-CM

## 2016-03-17 DIAGNOSIS — G8929 Other chronic pain: Secondary | ICD-10-CM | POA: Diagnosis present

## 2016-03-17 DIAGNOSIS — S92101A Unspecified fracture of right talus, initial encounter for closed fracture: Secondary | ICD-10-CM | POA: Diagnosis not present

## 2016-03-17 DIAGNOSIS — M25571 Pain in right ankle and joints of right foot: Secondary | ICD-10-CM | POA: Diagnosis not present

## 2016-03-17 HISTORY — DX: Major depressive disorder, single episode, unspecified: F32.9

## 2016-03-17 HISTORY — DX: Depression, unspecified: F32.A

## 2016-03-17 HISTORY — PX: CLOSED REDUCTION ANKLE FRACTURE: SUR210

## 2016-03-17 HISTORY — DX: Essential (primary) hypertension: I10

## 2016-03-17 HISTORY — DX: Unspecified fracture of shaft of unspecified tibia, initial encounter for closed fracture: S82.209A

## 2016-03-17 LAB — CBC WITH DIFFERENTIAL/PLATELET
BASOS ABS: 0 10*3/uL (ref 0.0–0.1)
Basophils Relative: 0 %
EOS ABS: 0.1 10*3/uL (ref 0.0–0.7)
EOS PCT: 2 %
HCT: 39.6 % (ref 36.0–46.0)
Hemoglobin: 13 g/dL (ref 12.0–15.0)
Lymphocytes Relative: 29 %
Lymphs Abs: 2.1 10*3/uL (ref 0.7–4.0)
MCH: 29.3 pg (ref 26.0–34.0)
MCHC: 32.8 g/dL (ref 30.0–36.0)
MCV: 89.2 fL (ref 78.0–100.0)
Monocytes Absolute: 0.5 10*3/uL (ref 0.1–1.0)
Monocytes Relative: 7 %
Neutro Abs: 4.6 10*3/uL (ref 1.7–7.7)
Neutrophils Relative %: 62 %
PLATELETS: 171 10*3/uL (ref 150–400)
RBC: 4.44 MIL/uL (ref 3.87–5.11)
RDW: 12.6 % (ref 11.5–15.5)
WBC: 7.4 10*3/uL (ref 4.0–10.5)

## 2016-03-17 LAB — BASIC METABOLIC PANEL
Anion gap: 11 (ref 5–15)
BUN: 19 mg/dL (ref 6–20)
CO2: 23 mmol/L (ref 22–32)
Calcium: 9.5 mg/dL (ref 8.9–10.3)
Chloride: 104 mmol/L (ref 101–111)
Creatinine, Ser: 0.84 mg/dL (ref 0.44–1.00)
GFR calc Af Amer: >60 mL/min (ref >60)
Glucose, Bld: 96 mg/dL (ref 65–99)
POTASSIUM: 3.4 mmol/L — AB (ref 3.5–5.1)
SODIUM: 138 mmol/L (ref 135–145)

## 2016-03-17 LAB — PROTIME-INR
INR: 1.03 (ref 0.00–1.49)
PROTHROMBIN TIME: 13.7 s (ref 11.6–15.2)

## 2016-03-17 MED ORDER — CEFAZOLIN IN D5W 1 GM/50ML IV SOLN
1.0000 g | Freq: Once | INTRAVENOUS | Status: AC
  Administered 2016-03-17: 1 g via INTRAVENOUS
  Filled 2016-03-17: qty 50

## 2016-03-17 MED ORDER — VITAMIN D 1000 UNITS PO TABS
5000.0000 [IU] | ORAL_TABLET | Freq: Every day | ORAL | Status: DC
  Administered 2016-03-18 – 2016-03-20 (×3): 5000 [IU] via ORAL
  Filled 2016-03-17 (×3): qty 5

## 2016-03-17 MED ORDER — LINACLOTIDE 290 MCG PO CAPS
290.0000 ug | ORAL_CAPSULE | Freq: Every day | ORAL | Status: DC
  Administered 2016-03-18: 290 ug via ORAL
  Filled 2016-03-17 (×3): qty 1

## 2016-03-17 MED ORDER — HYDROCHLOROTHIAZIDE 25 MG PO TABS
25.0000 mg | ORAL_TABLET | Freq: Every day | ORAL | Status: DC
  Administered 2016-03-18: 25 mg via ORAL
  Filled 2016-03-17 (×2): qty 1

## 2016-03-17 MED ORDER — PROMETHAZINE HCL 25 MG/ML IJ SOLN
12.5000 mg | Freq: Four times a day (QID) | INTRAMUSCULAR | Status: DC | PRN
Start: 2016-03-17 — End: 2016-03-20
  Filled 2016-03-17: qty 1

## 2016-03-17 MED ORDER — BUPIVACAINE HCL (PF) 0.5 % IJ SOLN
10.0000 mL | Freq: Once | INTRAMUSCULAR | Status: AC
  Administered 2016-03-17: 10 mL

## 2016-03-17 MED ORDER — MIDAZOLAM HCL 2 MG/2ML IJ SOLN
2.0000 mg | Freq: Once | INTRAMUSCULAR | Status: AC
  Administered 2016-03-17: 2 mg via INTRAVENOUS
  Filled 2016-03-17: qty 2

## 2016-03-17 MED ORDER — PROPOFOL 10 MG/ML IV BOLUS
200.0000 mg | Freq: Once | INTRAVENOUS | Status: AC
  Administered 2016-03-17: 50 mg via INTRAVENOUS
  Filled 2016-03-17: qty 20

## 2016-03-17 MED ORDER — FENTANYL CITRATE (PF) 100 MCG/2ML IJ SOLN
100.0000 ug | Freq: Once | INTRAMUSCULAR | Status: AC
  Administered 2016-03-17: 100 ug via INTRAVENOUS
  Filled 2016-03-17: qty 2

## 2016-03-17 MED ORDER — ALPRAZOLAM 0.5 MG PO TABS: 0.5000 mg | ORAL_TABLET | Freq: Two times a day (BID) | ORAL | Status: DC | PRN

## 2016-03-17 MED ORDER — MIDAZOLAM HCL 2 MG/2ML IJ SOLN
INTRAMUSCULAR | Status: AC | PRN
  Administered 2016-03-17: 2 mg via INTRAVENOUS

## 2016-03-17 MED ORDER — HYDROMORPHONE HCL 1 MG/ML IJ SOLN
1.0000 mg | INTRAMUSCULAR | Status: DC | PRN
  Administered 2016-03-17 – 2016-03-18 (×7): 1 mg via INTRAVENOUS
  Filled 2016-03-17 (×8): qty 1

## 2016-03-17 MED ORDER — MIDAZOLAM HCL 2 MG/2ML IJ SOLN
2.0000 mg | Freq: Once | INTRAMUSCULAR | Status: DC
  Filled 2016-03-17: qty 2

## 2016-03-17 MED ORDER — LOSARTAN POTASSIUM 50 MG PO TABS
100.0000 mg | ORAL_TABLET | Freq: Every day | ORAL | Status: DC
  Administered 2016-03-18 (×2): 100 mg via ORAL
  Filled 2016-03-17 (×2): qty 2

## 2016-03-17 MED ORDER — ONDANSETRON HCL 4 MG/2ML IJ SOLN
4.0000 mg | Freq: Once | INTRAMUSCULAR | Status: AC
  Administered 2016-03-17: 4 mg via INTRAVENOUS
  Filled 2016-03-17: qty 2

## 2016-03-17 MED ORDER — FENTANYL CITRATE (PF) 100 MCG/2ML IJ SOLN
INTRAMUSCULAR | Status: AC | PRN
  Administered 2016-03-17: 100 ug via INTRAVENOUS

## 2016-03-17 MED ORDER — FENTANYL CITRATE (PF) 100 MCG/2ML IJ SOLN
100.0000 ug | Freq: Once | INTRAMUSCULAR | Status: DC
  Filled 2016-03-17: qty 2

## 2016-03-17 MED ORDER — HYDROCHLOROTHIAZIDE 25 MG PO TABS: 50.0000 mg | ORAL_TABLET | Freq: Every day | ORAL | Status: DC

## 2016-03-17 MED ORDER — METHOCARBAMOL 1000 MG/10ML IJ SOLN
500.0000 mg | Freq: Four times a day (QID) | INTRAVENOUS | Status: DC | PRN
  Filled 2016-03-17: qty 5

## 2016-03-17 MED ORDER — ACETAMINOPHEN 650 MG RE SUPP
650.0000 mg | Freq: Four times a day (QID) | RECTAL | Status: DC | PRN
Start: 2016-03-17 — End: 2016-03-20

## 2016-03-17 MED ORDER — FENTANYL CITRATE (PF) 100 MCG/2ML IJ SOLN
50.0000 ug | Freq: Once | INTRAMUSCULAR | Status: DC
  Filled 2016-03-17: qty 2

## 2016-03-17 MED ORDER — FENTANYL CITRATE (PF) 100 MCG/2ML IJ SOLN
50.0000 ug | INTRAMUSCULAR | Status: DC | PRN
  Administered 2016-03-17 (×3): 50 ug via INTRAVENOUS
  Filled 2016-03-17 (×2): qty 2

## 2016-03-17 MED ORDER — ONDANSETRON HCL 4 MG/2ML IJ SOLN
4.0000 mg | Freq: Once | INTRAMUSCULAR | Status: DC
  Filled 2016-03-17: qty 2

## 2016-03-17 MED ORDER — VITAMIN D (CHOLECALCIFEROL) 25 MCG (1000 UT) PO CAPS: 5000.0000 [IU]/d | ORAL_CAPSULE | Freq: Every day | ORAL | Status: DC

## 2016-03-17 MED ORDER — BUPIVACAINE HCL 0.5 % IJ SOLN
50.0000 mL | Freq: Once | INTRAMUSCULAR | Status: DC
  Filled 2016-03-17: qty 50

## 2016-03-17 MED ORDER — OXYCODONE HCL 5 MG PO TABS
5.0000 mg | ORAL_TABLET | ORAL | Status: DC | PRN
  Administered 2016-03-18 (×2): 15 mg via ORAL
  Administered 2016-03-18: 10 mg via ORAL
  Administered 2016-03-19 (×4): 15 mg via ORAL
  Filled 2016-03-17 (×2): qty 3
  Filled 2016-03-17: qty 2
  Filled 2016-03-17 (×4): qty 3

## 2016-03-17 MED ORDER — METHOCARBAMOL 500 MG PO TABS
500.0000 mg | ORAL_TABLET | Freq: Four times a day (QID) | ORAL | Status: DC | PRN
  Administered 2016-03-18 – 2016-03-20 (×2): 500 mg via ORAL
  Filled 2016-03-17 (×3): qty 1

## 2016-03-17 MED ORDER — DIPHENHYDRAMINE HCL 12.5 MG/5ML PO ELIX
12.5000 mg | ORAL_SOLUTION | ORAL | Status: DC | PRN
  Administered 2016-03-18: 25 mg via ORAL
  Filled 2016-03-17 (×2): qty 10

## 2016-03-17 MED ORDER — ACETAMINOPHEN 325 MG PO TABS: 650.0000 mg | ORAL_TABLET | Freq: Four times a day (QID) | ORAL | Status: DC | PRN

## 2016-03-17 MED ORDER — AMITRIPTYLINE HCL 10 MG PO TABS
5.0000 mg | ORAL_TABLET | Freq: Every day | ORAL | Status: DC
  Administered 2016-03-18 – 2016-03-19 (×3): 10 mg via ORAL
  Filled 2016-03-17 (×3): qty 1

## 2016-03-17 MED ORDER — VENLAFAXINE HCL 75 MG PO TABS
75.0000 mg | ORAL_TABLET | Freq: Two times a day (BID) | ORAL | Status: DC
  Administered 2016-03-18 – 2016-03-20 (×2): 75 mg via ORAL
  Filled 2016-03-17 (×5): qty 1

## 2016-03-17 MED ORDER — LOSARTAN POTASSIUM-HCTZ 100-25 MG PO TABS: 1.0000 | ORAL_TABLET | Freq: Every day | ORAL | Status: DC

## 2016-03-17 MED ORDER — FLUOXETINE HCL 20 MG PO CAPS: 20.0000 mg | ORAL_CAPSULE | Freq: Every day | ORAL | Status: DC

## 2016-03-17 MED ORDER — SODIUM CHLORIDE 0.9 % IV SOLN
INTRAVENOUS | Status: DC
  Administered 2016-03-17 – 2016-03-18 (×2): via INTRAVENOUS

## 2016-03-17 MED ORDER — ONDANSETRON HCL 4 MG/2ML IJ SOLN
4.0000 mg | Freq: Four times a day (QID) | INTRAMUSCULAR | Status: DC | PRN
  Administered 2016-03-17 – 2016-03-19 (×5): 4 mg via INTRAVENOUS
  Filled 2016-03-17 (×5): qty 2

## 2016-03-17 NOTE — Telephone Encounter (Signed)
per pof to sch pt appt-sch appt @ [facility]-sent staff message to Blima Singer to fax pt med rec/notes to [phone] Radiation would acall ot sch appt-gave pt copy of avs

## 2016-03-17 NOTE — ED Notes (Signed)
Prior to dr Jeneen Rinks putting ankle back in husband states pt was NOT in her seatbelt, states they were coming from cancer center and pt was upset

## 2016-03-17 NOTE — ED Notes (Signed)
Patient has returned back from being out of the department; patient placed back on continuous pulse oximetry, blood pressure cuff and oxygen [place] (2L); visitors at bedside
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
## 2016-03-17 NOTE — ED Notes (Signed)
Patient being transported to Radiology Department at this time; visitors waiting in room

## 2016-03-17 NOTE — Progress Notes (Signed)
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  HISTORY OF PRESENTING ILLNESS:   Priscilla Higgins is a wonderful 55 y.o. female who has been referred to [place] by Dr Emelda Fear, DO  for evaluation and management of newly diagnosed follicular lymphoma.  Patient has a history of hypertension, depression/anxiety, degenerative disc disease with multiple spinal surgeries and chronic pain who reports having had a scaly somewhat pruritic lesion in front of her right ear. She reports this has been present for several months and she eventually saw a dermatologist who biopsied this. Pathology revealed presence of follicular lymphoma.  Patient also reports having a swelling over her left fore head for several months as well. This has not been biopsied. Patient reports perimenopausal symptoms including hot flashes but does not report significant night sweats or weight loss or new fatigue.  She does not reveal any palpable enlarged lymph nodes or other overt new focal symptoms. No shortness of breath or chest pain. No abdominal pain or significant distention.  INTERVAL HISTORY  Priscilla Higgins is here with her husband for follow-up regarding her follicular lymphoma. She had a PET CT scan that showed residual lymphoma in the right preauricular region. No clearly defined uptake at her left temple skin lesion. As an indeterminant FDG avid right pelvic nodule which could be remnants of ovary or an atypical lymph node as per the radiologist. We discussed that the patient would need to follow-up with her GYN doctor and consider ultrasound of the pelvis to rule out other pathology. No fevers no chills no night sweats or weight loss.  MEDICAL HISTORY:   #1 anxiety #2 degenerative disc disease with multiple back surgeries and  chronic pain  #3 presence of spinal stimulator #4 hypertension #5 depression/anxiety #6 perimenopausal symptoms #7History of cervical cancer status post total abdominal hysterectomy and bilateral salpingo-oophorectomy at age 43 yrs.  SURGICAL HISTORY:  Back surgery 5 times Neck surgery 1 fusion. Dorsal column stimulator. Cholecystectomy Bladder lift 2 History of cervical cancer status post total abdominal hysterectomy and bilateral salpingo-oophorectomy at age 64  SOCIAL HISTORY: Social History   Social History  . Marital Status: Married    Spouse Name: N/A  . Number of Children: N/A  . Years of Education: N/A   Occupational History  . Not on file.   Social History Main Topics  . Smoking status: Never Smoker   . Smokeless tobacco: Never Used  . Alcohol Use: No  . Drug Use: No  . Sexual Activity: Not on file   Other Topics Concern  . Not on file   Social History Narrative  Lifelong nonsmoker Minimal alcohol use  FAMILY HISTORY:  No family history of any cancers or known blood disorders.  ALLERGIES:  is allergic to latex; other; sulfa antibiotics; and tape.  MEDICATIONS:  No current facility-administered medications for this visit.   Current Outpatient Prescriptions  Medication Sig Dispense Refill  . ALPRAZolam (XANAX) 0.5 MG tablet     . amitriptyline (ELAVIL) 25 MG tablet Take 25 mg by mouth at bedtime.  5  . estradiol (VIVELLE-DOT) 0.0375 MG/24HR APPLY 1 PATCH TO SKIN TWICE A WEEK AS DIRECTED  5  . FLUoxetine (PROZAC) 20 MG capsule     . hydrochlorothiazide (HYDRODIURIL) 50 MG tablet Reported on 03/03/2016    . HYDROcodone-acetaminophen (NORCO) 10-325 MG tablet  Take 1 tablet by mouth 3 (three) times daily.  0  . linaclotide (LINZESS) 290 MCG CAPS capsule Take 290 mcg by mouth daily before breakfast.    . losartan-hydrochlorothiazide (HYZAAR) 100-25 MG tablet Take 1 tablet by mouth daily.  4  . venlafaxine (EFFEXOR) 75 MG tablet     . Vitamin D,  Cholecalciferol, 1000 units CAPS Take 5,000 Units/day by mouth.     Facility-Administered Medications Ordered in Other Visits  Medication Dose Route Frequency Provider Last Rate Last Dose  . fentaNYL (SUBLIMAZE) injection 100 mcg  100 mcg Intravenous Once Tanna Furry, MD      . ondansetron [facility]) injection 4 mg  4 mg Intravenous Once Tanna Furry, MD        REVIEW OF SYSTEMS:    10 Point review of Systems was done is negative except as noted above.  PHYSICAL EXAMINATION: ECOG PERFORMANCE STATUS: 1 - Symptomatic but completely ambulatory  . Filed Vitals:   03/17/16 1017  BP: 155/92  Pulse: 68  Temp: 98 F (36.7 C)  Resp: 16   Filed Weights   03/17/16 1017  Weight: 185 lb (83.915 kg)   .Body mass index is 29.87 kg/(m^2).  GENERAL:alert, in no acute distress and comfortable SKIN: Somewhat scaly lesion in the front of her right pinna. Subcutaneous swelling over the left fore head. EYES: normal, conjunctiva are pink and non-injected, sclera clear OROPHARYNX:no exudate, no erythema and lips, buccal mucosa, and tongue normal  NECK: supple, no JVD, thyroid normal size, non-tender, without nodularity LYMPH:  no palpable lymphadenopathy in the cervical, axillary or inguinal LUNGS: clear to auscultation with normal respiratory effort HEART: regular rate & rhythm,  no murmurs and no lower extremity edema ABDOMEN: abdomen soft, non-tender, normoactive bowel sounds , no palpable hepatosplenomegaly. Musculoskeletal: no cyanosis of digits and no clubbing  PSYCH: alert & oriented x 3 with fluent speech NEURO: no focal motor/sensory deficits  LABORATORY DATA:  I have reviewed the data as listed  . CBC Latest Ref Rng 03/17/2016 03/03/2016  WBC 4.0 - 10.5 K/uL 7.4 6.1  Hemoglobin 12.0 - 15.0 g/dL 13.0 12.8  Hematocrit 36.0 - 46.0 % 39.6 38.0  Platelets 150 - 400 K/uL 171 207    . CMP Latest Ref Rng 03/03/2016  Glucose 70 - 140 mg/dl 96  BUN 7.0 - 26.0 mg/dL 19.0  Creatinine 0.6 - 1.1  mg/dL 0.8  Sodium 136 - 145 mEq/L 140  Potassium 3.5 - 5.1 mEq/L 3.8  CO2 22 - 29 mEq/L 30(H)  Calcium 8.4 - 10.4 mg/dL 9.5  Total Protein 6.4 - 8.3 g/dL 7.4  Total Bilirubin 0.20 - 1.20 mg/dL 0.36  Alkaline Phos 40 - 150 U/L 105  AST 5 - 34 U/L 21  ALT 0 - 55 U/L 25    . Lab Results  Component Value Date   LDH 178 03/03/2016        RADIOGRAPHIC STUDIES: I have personally reviewed the radiological images as listed and agreed with the findings in the report. Nm Pet Image Initial (pi) Whole Body  03/15/2016  CLINICAL DATA:  Initial treatment strategy for right periauricular follicular lymphoma. EXAM: NUCLEAR MEDICINE PET WHOLE BODY TECHNIQUE: 9.06 mCi F-18 FDG was injected intravenously. Full-ring PET imaging was performed from the vertex to the feet after the radiotracer. CT data was obtained and used for attenuation correction and anatomic localization. FASTING BLOOD GLUCOSE:  Value:  99 mg/dl COMPARISON:  None. FINDINGS: NECK No hypermetabolic cervical lymph nodes are identified.There are no  lesions of the pharyngeal mucosal space. There is focal subcutaneous soft tissue thickening in the right pre-auricular region which is hypermetabolic with an SUV max of 7.2. This area of subcutaneous thickening measures approximately 13 mm on CT image 39. CHEST There are no hypermetabolic mediastinal, hilar or axillary lymph nodes. There is no suspicious pulmonary activity. The lungs are clear. ABDOMEN/PELVIS There is no hypermetabolic activity within the liver, adrenal glands, spleen or pancreas. The spleen is normal in size. There are 2 areas of hypermetabolic activity in the right pelvis which are not clearly related to the right ureter. The more superior 1 appears to correspond with small lymph nodes, demonstrating an SUV max of 4.9. The more inferior 1 has an SUV max of 16.1, corresponding with a soft tissue nodule measuring 1.8 x 2.1 cm on image 197. It is uncertain as to whether this could  reflect an atypical lymph node or a remnant of the right ovary status post hysterectomy. There is a small high left inguinal node which is mildly hypermetabolic (SUV max 4.5). Inguinal nodal activity is otherwise similar to blood pool. No other abnormal nodal or peritoneal activity. SKELETON There is no hypermetabolic activity to suggest osseous metastatic disease. Low-level activity associate with the left sternoclavicular joint is likely degenerative. Postsurgical changes are noted status post lower lumbar laminectomy and fusion. Patient has a thoracic spinal stimulator. Extremities: There is no abnormal nodal activity within the extremities. There is no abnormal activity within the subcutaneous tissues or muscles. There is focal hypermetabolic osseous activity posteriorly in the distal left femur and medially in the right tibial plateau. This activity demonstrates an SUV max of 4.4 and 5.8, respectively. No clear osseous abnormalities are seen in these areas. IMPRESSION: 1. The area recently biopsied in the right pre-auricular area is hypermetabolic. No other suspicious activity within the neck or chest. 2. Potential hypermetabolic nodal activity in the right pelvis and left groin. Activity within the right adnexa could be secondary to ovarian remnant status post hysterectomy. 3. Indeterminate osseous activity in the distal left femur and proximal right tibia, likely incidental. Electronically Signed   By: Richardean Sale M.D.   On: 03/15/2016 13:53    ASSESSMENT & PLAN:  55 year old female with  1) newly diagnosed follicular lymphoma involving the skin in the right preauricular area. Also possible involvement over the left fore head. No obvious palpable lymphadenopathy or hepatosplenomegaly. Blood counts are stable. LDH levels within normal limits No overt constitutional symptoms at this time. FDG avid right preauricular cutaneous follicular lymphoma. No significant FDG avid lymphadenopathy. To  suggest significant burden of systemic disease. Some borderline FDG avid left inguinal adenopathy and some indeterminate or shows activity in the left distal femur and proximal right tibia which is likely nonspecific. Plan -PET/CT results were discussed in detail and images were reviewed with the patient -No clear indication for systemic therapy of the patient's limited follicular lymphoma at this time. -We'll refer her to dermatology to consider biopsy of her left temporal lesion to rule out presence of a second follicular lymphoma lesion. This did not obviously light on the PET/CT scan. -Radiation oncology referral to consider local radiation therapy to her right preauricular cutaneous follicular lymphoma and a biopsy proven to be [place] to her left temporal lesion  2) right adnexal FDG avidity in possible Rt ovarian remanent. Plan -Follow-up with Gyn Dr Marjory Lies in [place] to consider pelvic ultrasound in further evaluation to rule out other pathology.  Continue follow-up with primary care physician/ Dr.  Emelda Fear  Return to care with Dr. Irene Limbo in 3 months with repeat CBC, CMP, LDH  All of the patients and her husbands questions were answered to their apparent satisfaction. The patient knows to call the clinic with any problems, questions or concerns.  I spent 25 minutes counseling the patient face to face. The total time spent in the appointment was 30 minutes and more than 50% was on counseling and direct patient cares.    Sullivan Lone MD [place] AAHIVMS [facility] Hematology/Oncology Physician [facility]  (Office):       [phone] (Work cell):  [phone] (Fax):           [phone]  03/17/2016 1:31 PM

## 2016-03-17 NOTE — Progress Notes (Signed)
Orthopedic Tech Progress Note Patient Details:  Priscilla Higgins 1961/07/07 [id number]  Ortho Devices Type of Ortho Device: Ace wrap, Post (short leg) splint Ortho Device/Splint Interventions: Application   Phaedra Colgate 03/17/2016, 2:13 PM

## 2016-03-17 NOTE — ED Notes (Signed)
Myself, Martie Round, RN and [place], RN assisted Jeneen Rinks, MD with getting patient off of LSB; c-collar still intact; patient placed on oxygen [place] (2L); visitor stepped out
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
## 2016-03-17 NOTE — ED Notes (Signed)
Ortho tech called and will see patient in ED
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
## 2016-03-17 NOTE — ED Notes (Signed)
Ortho tech is present in room

## 2016-03-17 NOTE — Progress Notes (Signed)
Orthopedic Tech Progress Note Patient Details:  Priscilla Higgins Jun 30, 1961 [id number]  Ortho Devices Type of Ortho Device: Ace wrap, Post (short leg) splint, Stirrup splint Ortho Device/Splint Location: RLE Ortho Device/Splint Interventions: Ordered, Application   Braulio Bosch 03/17/2016, 4:50 PM
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
## 2016-03-18 ENCOUNTER — Encounter (HOSPITAL_COMMUNITY): Payer: Self-pay | Admitting: General Practice

## 2016-03-18 ENCOUNTER — Telehealth: Payer: Self-pay | Admitting: Hematology

## 2016-03-18 MED ORDER — METOCLOPRAMIDE HCL 5 MG/ML IJ SOLN
5.0000 mg | Freq: Four times a day (QID) | INTRAMUSCULAR | Status: DC
  Filled 2016-03-18: qty 2

## 2016-03-18 MED ORDER — ENOXAPARIN SODIUM 30 MG/0.3ML ~~LOC~~ SOLN
30.0000 mg | Freq: Two times a day (BID) | SUBCUTANEOUS | Status: DC
  Administered 2016-03-18 – 2016-03-20 (×5): 30 mg via SUBCUTANEOUS
  Filled 2016-03-18 (×5): qty 0.3

## 2016-03-18 MED ORDER — METOCLOPRAMIDE HCL 5 MG/ML IJ SOLN
5.0000 mg | Freq: Four times a day (QID) | INTRAMUSCULAR | Status: DC | PRN
  Administered 2016-03-18 – 2016-03-19 (×3): 10 mg via INTRAVENOUS
  Filled 2016-03-18 (×2): qty 2

## 2016-03-18 MED ORDER — CETYLPYRIDINIUM CHLORIDE 0.05 % MT LIQD
7.0000 mL | Freq: Two times a day (BID) | OROMUCOSAL | Status: DC
  Administered 2016-03-18 – 2016-03-19 (×3): 7 mL via OROMUCOSAL

## 2016-03-18 NOTE — Telephone Encounter (Signed)
Faxed records to Dr. Alveta Heimlich [phone]
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
## 2016-03-19 DIAGNOSIS — I10 Essential (primary) hypertension: Secondary | ICD-10-CM | POA: Diagnosis present

## 2016-03-19 DIAGNOSIS — S92101A Unspecified fracture of right talus, initial encounter for closed fracture: Secondary | ICD-10-CM | POA: Diagnosis present

## 2016-03-19 DIAGNOSIS — F329 Major depressive disorder, single episode, unspecified: Secondary | ICD-10-CM | POA: Diagnosis present

## 2016-03-19 DIAGNOSIS — Z981 Arthrodesis status: Secondary | ICD-10-CM | POA: Diagnosis not present

## 2016-03-19 DIAGNOSIS — M25571 Pain in right ankle and joints of right foot: Secondary | ICD-10-CM | POA: Diagnosis present

## 2016-03-19 DIAGNOSIS — G8929 Other chronic pain: Secondary | ICD-10-CM | POA: Diagnosis present

## 2016-03-19 MED ORDER — LOSARTAN POTASSIUM 50 MG PO TABS
100.0000 mg | ORAL_TABLET | Freq: Every day | ORAL | Status: DC
  Administered 2016-03-19 – 2016-03-20 (×2): 100 mg via ORAL
  Filled 2016-03-19 (×2): qty 2

## 2016-03-19 MED ORDER — HYDROCHLOROTHIAZIDE 25 MG PO TABS
25.0000 mg | ORAL_TABLET | Freq: Every day | ORAL | Status: DC
  Administered 2016-03-20: 25 mg via ORAL
  Filled 2016-03-19: qty 1
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
## 2016-03-19 NOTE — Progress Notes (Signed)
Subjective:     Patient reports pain as moderate.  Slow progress with therapy, but feeling better and less nausea.  Objective: Vital signs in last 24 hours: Temp:  [98.1 F (36.7 C)-99 F (37.2 C)] 99 F (37.2 C) (06/23 0438) Pulse Rate:  [61-74] 74 (06/23 0438) Resp:  [16-17] 16 (06/23 0438) BP: (117-146)/(71) 146/71 mmHg (06/23 0438) SpO2:  [96 %-100 %] 96 % (06/23 0438)  Intake/Output from previous day: 06/22 0701 - 06/23 0700 In: 480 [P.O.:480] Out: -  Intake/Output this shift: Total I/O In: 120 [P.O.:120] Out: -    Recent Labs  03/17/16 1330  HGB 13.0    Recent Labs  03/17/16 1330  WBC 7.4  RBC 4.44  HCT 39.6  PLT 171    Recent Labs  03/17/16 1330  NA 138  K 3.4*  CL 104  CO2 23  BUN 19  CREATININE 0.84  GLUCOSE 96  CALCIUM 9.5    Recent Labs  03/17/16 1330  INR 1.03    Sensation intact distally Intact pulses distally No cellulitis present Compartment soft  Assessment/Plan:     Up with therapy Plan for discharge tomorrow Discharge home with home health  Priscilla Higgins 03/19/2016, 6:49 AM

## 2016-03-19 NOTE — Care Management Important Message (Signed)
Important Message  Patient Details  Name: Priscilla Higgins MRN: [id number] Date of Birth: 1961/06/11   Medicare Important Message Given:  Yes    Loann Quill 03/19/2016, 9:54 AM
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
## 2016-03-20 MED ORDER — METHOCARBAMOL 500 MG PO TABS: 500.0000 mg | ORAL_TABLET | Freq: Four times a day (QID) | ORAL | Status: DC | PRN

## 2016-03-20 MED ORDER — OXYCODONE-ACETAMINOPHEN 5-325 MG PO TABS: 1.0000 | ORAL_TABLET | ORAL | Status: DC | PRN

## 2016-03-20 MED ORDER — ASPIRIN EC 325 MG PO TBEC: 325.0000 mg | DELAYED_RELEASE_TABLET | Freq: Two times a day (BID) | ORAL | Status: DC
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
## 2016-03-20 NOTE — Discharge Instructions (Signed)
[facility] [employer] is a great source of information about community services available.  Access by dialing 2-1-1 from anywhere in [place], or by website -  CustodianSupply.fi.   Other Local Resources (Updated 09/2015)  Dental  Care   Services    Phone Number and Address  Cost  [facility] For children 62 - 56 years of age:   Cleaning  Tooth brushing/flossing instruction  Sealants, fillings, crowns  Extractions  Emergency treatment  [phone] [address], [place] [address] Charges based on family income.  Medicaid and some insurance plans accepted.     [facility] - [facility], fillings, crowns  Extractions  Emergency treatment [phone] [address] [place], [place]  Pregnant women 39 years of age or older with a Medicaid card  [facility] - [place]  Cleaning  Sealants, fillings, crowns  Extractions  Emergency treatment [phone] [address] [place], [place] Pregnant women 73 years of age or older with a Medicaid card  [facility] For children 43 - 33 years of age:   Cleaning  Tooth brushing/flossing instruction  Sealants, fillings, crowns  Extractions  Emergency treatment Limited orthodontic services for patients with Medicaid [phone] [address], [place] [address] Medicaid and [facility] Health Choice cover for children up to age 71 and pregnant women.  Parents of children up to age 47 without Medicaid pay a reduced fee at time of service.  [place] For children 88 - 33 years of age:   Cleaning  Tooth brushing/flossing instruction  Sealants, fillings, crowns  Extractions  Emergency treatment Limited orthodontic services for patients with Medicaid  [phone] [place], [place].  Medicaid and [place] Health Choice cover for children up to age 26 and pregnant women.  Parents of children up to age 42 without Medicaid pay a reduced fee.  [facility], [facility]  Sealants, fillings, crowns  Extractions  Hours: Tuesdays and Thursdays, 4:15 - 8 pm [phone] [address] [place], [place] [address] Services free of charge to [facility] residents ages 18-64 who do not have health insurance, Medicare, [place], or [place] benefits and fall within federal poverty guidelines  [place] care in addition to primary medical care, nutritional counseling, and pharmacy:  Engineer, drilling, fillings, crowns  Extractions                  [phone] [facility] [place], [place], [place], [place], [place], [place] [facility], [place], [place] [facility] [place], [place], [place], most insurance.  Also provides services available to all with fees adjusted based on ability to pay.    [facility]  Cleaning  Tooth brushing/flossing instruction  Sealants, fillings, crowns  Extractions  Emergency treatment Hours: Tuesdays, Thursdays, and Fridays from 8 am to 5 pm by appointment only. [phone] [place], [place] [address] [facility] residents with Medicaid (depending on eligibility) and children with [facility] Health Choice - call for more information.  [facility]  Extractions only  Hours: 2nd and 4th Thursday of each month from 6:30 am - 9 am.   [phone] ext. [place], [place] [address] Ages 51 and older only.  Patients are seen on a first come, first served basis.  [employer]  [employer]  Extractions  Orthodontics  Endodontics  Implants/Crowns/Bridges  Complete and partial dentures [phone] [place], [place] Patients must complete an application for services.  There is often a waiting list.     No wait bearing on your right foot/ankle at all. Ice and elevation as needed for swelling.

## 2016-03-20 NOTE — Progress Notes (Signed)
Patient ID: Priscilla Higgins, female   DOB: 1961/09/03, 55 y.o.   MRN: [id number] Looks good overall.  Can be discharged to home today.
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
## 2016-03-20 NOTE — Progress Notes (Signed)
Pt discharge education and instructions completed with pt and spouse at bedside; both voices understanding and denies any questions. Pt IV removed; pt handed her prescriptions for Roxicet, robaxin and aspirin. Pt discharge home with family to transport her home. Pt home DME equipments including wheelchair and 3N1 delivered to pt at bedside. Pt transported off unit via her personal wheelchair with family and belongings to the side. Francis Gaines [place] Condon RN.

## 2016-04-16 ENCOUNTER — Telehealth: Payer: Self-pay | Admitting: Hematology

## 2016-04-16 NOTE — Telephone Encounter (Signed)
called Rhonda in [place] to advise that pt had referral from Dr Irene Limbo since 6/21-adv to call me to let me know if in [facility] and to call pt to sch appt-adv to call once completed
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
## 2016-04-20 ENCOUNTER — Encounter: Payer: Self-pay | Admitting: Radiation Oncology

## 2016-04-20 ENCOUNTER — Ambulatory Visit
Admission: RE | Admit: 2016-04-20 | Discharge: 2016-04-20 | Disposition: A | Discharge status: Home / Self Care | Payer: Medicare HMO | Source: Ambulatory Visit | Attending: Radiation Oncology | Admitting: Radiation Oncology

## 2016-04-20 DIAGNOSIS — Z7982 Long term (current) use of aspirin: Secondary | ICD-10-CM | POA: Diagnosis not present

## 2016-04-20 DIAGNOSIS — Z79899 Other long term (current) drug therapy: Secondary | ICD-10-CM | POA: Diagnosis not present

## 2016-04-20 DIAGNOSIS — C8290 Follicular lymphoma, unspecified, unspecified site: Secondary | ICD-10-CM

## 2016-04-20 DIAGNOSIS — Z882 Allergy status to sulfonamides status: Secondary | ICD-10-CM | POA: Insufficient documentation

## 2016-04-20 DIAGNOSIS — Z91040 Latex allergy status: Secondary | ICD-10-CM | POA: Diagnosis not present

## 2016-04-20 DIAGNOSIS — C8260 Cutaneous follicle center lymphoma, unspecified site: Secondary | ICD-10-CM | POA: Insufficient documentation

## 2016-04-20 DIAGNOSIS — C8269 Cutaneous follicle center lymphoma, extranodal and solid organ sites: Secondary | ICD-10-CM | POA: Insufficient documentation

## 2016-04-20 HISTORY — DX: Unspecified fracture of shaft of unspecified tibia, initial encounter for closed fracture: S82.209A
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
## 2016-04-21 ENCOUNTER — Telehealth: Payer: Self-pay | Admitting: *Deleted

## 2016-04-21 NOTE — Telephone Encounter (Signed)
Called patient to inform of appt. With Dr. Rolm Bookbinder on 05-05-16- arrival time - 8:25 am, patient to bring list of meds and insurance card, spoke with patient and she is aware of this appt.

## 2016-05-20 ENCOUNTER — Telehealth: Payer: Self-pay

## 2016-05-20 NOTE — Telephone Encounter (Signed)
I returned Ms. Escalera's phone call from earlier today. She was checking to see if Dr. Isidore Moos had received any results from her visit with Dr. Josie Dixon office 05/05/16. I told her that I have asked to have those results sent to [place], and have notified Dr. Isidore Moos about Ms. Vilar's phone call.

## 2016-05-28 ENCOUNTER — Other Ambulatory Visit: Payer: Self-pay | Admitting: Radiation Oncology

## 2016-06-16 ENCOUNTER — Telehealth: Payer: Self-pay | Admitting: *Deleted

## 2016-06-16 ENCOUNTER — Other Ambulatory Visit: Payer: Self-pay | Admitting: *Deleted

## 2016-06-16 NOTE — Telephone Encounter (Signed)
Received call from pt's pharmacy asking for refill on pt's nexium.  It has not been filled at this office but pt reports that it should come form [place] now.  Will message Dr Launa Flight RN for refill. Pharmacy # is [phone].
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
## 2016-06-16 NOTE — Telephone Encounter (Signed)
Above message entered in error-Please disregard.

## 2016-06-17 ENCOUNTER — Ambulatory Visit: Payer: Medicare HMO | Admitting: Hematology

## 2016-06-17 ENCOUNTER — Other Ambulatory Visit: Payer: Medicare HMO

## 2016-07-19 NOTE — Telephone Encounter (Signed)
XXXX 

## 2016-07-29 ENCOUNTER — Telehealth (INDEPENDENT_AMBULATORY_CARE_PROVIDER_SITE_OTHER): Payer: Self-pay | Admitting: Orthopaedic Surgery

## 2016-07-29 ENCOUNTER — Ambulatory Visit (INDEPENDENT_AMBULATORY_CARE_PROVIDER_SITE_OTHER): Payer: Self-pay | Admitting: Orthopaedic Surgery

## 2016-07-29 NOTE — Telephone Encounter (Signed)
Please advise Patient was on the schedule today, but cancelled

## 2016-07-29 NOTE — Telephone Encounter (Signed)
She should continue Motrin as needed.  Sounds like we do need to see her sometime in the near future for a re-assessment to see what else can be done.

## 2016-07-29 NOTE — Telephone Encounter (Signed)
Pt states she still having problems with her right ankle & she has been taking Motrin. Patient wants to know if she should continue the Motrin or Ibuprofen

## 2016-07-30 NOTE — Telephone Encounter (Signed)
Patient aware of the below note from Dr. Ninfa Linden

## 2016-08-27 ENCOUNTER — Ambulatory Visit
Admission: RE | Admit: 2016-08-27 | Discharge: 2016-08-27 | Disposition: A | Discharge status: Home / Self Care | Payer: Medicare HMO | Source: Ambulatory Visit | Attending: Radiation Oncology | Admitting: Radiation Oncology

## 2016-08-27 ENCOUNTER — Encounter: Payer: Self-pay | Admitting: Radiation Oncology

## 2016-08-27 VITALS — BP 137/91 | HR 63 | Temp 98.4°F | Resp 12 | Wt 181.4 lb

## 2016-08-27 DIAGNOSIS — Z7982 Long term (current) use of aspirin: Secondary | ICD-10-CM | POA: Insufficient documentation

## 2016-08-27 DIAGNOSIS — C8261 Cutaneous follicle center lymphoma, lymph nodes of head, face, and neck: Secondary | ICD-10-CM | POA: Insufficient documentation

## 2016-08-27 DIAGNOSIS — Z79899 Other long term (current) drug therapy: Secondary | ICD-10-CM | POA: Insufficient documentation
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
## 2016-08-30 ENCOUNTER — Telehealth: Payer: Self-pay | Admitting: *Deleted

## 2016-08-30 NOTE — Addendum Note (Signed)
Encounter addended by: Ernst Spell, RN on: 08/30/2016 10:38 AM<BR>    Actions taken: Charge Capture section accepted

## 2016-08-30 NOTE — Telephone Encounter (Signed)
Called patient to inform of appt. With Dr. Rolm Bookbinder on 09-17-16- arrival time - 9:50 am, spoke with patient and she is aware of this appt.

## 2016-09-09 ENCOUNTER — Other Ambulatory Visit (HOSPITAL_BASED_OUTPATIENT_CLINIC_OR_DEPARTMENT_OTHER): Payer: Medicare HMO

## 2016-09-09 ENCOUNTER — Encounter: Payer: Self-pay | Admitting: Hematology

## 2016-09-09 ENCOUNTER — Ambulatory Visit (HOSPITAL_BASED_OUTPATIENT_CLINIC_OR_DEPARTMENT_OTHER): Payer: Medicare HMO | Admitting: Hematology

## 2016-09-09 VITALS — BP 151/70 | HR 64 | Temp 97.8°F | Resp 18 | Ht 66.0 in | Wt 184.4 lb

## 2016-09-09 DIAGNOSIS — C8290 Follicular lymphoma, unspecified, unspecified site: Secondary | ICD-10-CM

## 2016-09-09 DIAGNOSIS — I10 Essential (primary) hypertension: Secondary | ICD-10-CM | POA: Diagnosis not present

## 2016-09-09 DIAGNOSIS — C8299 Follicular lymphoma, unspecified, extranodal and solid organ sites: Secondary | ICD-10-CM | POA: Diagnosis not present

## 2016-09-09 LAB — COMPREHENSIVE METABOLIC PANEL
ALBUMIN: 3.7 g/dL (ref 3.5–5.0)
ALK PHOS: 131 U/L (ref 40–150)
ALT: 20 U/L (ref 0–55)
AST: 16 U/L (ref 5–34)
Anion Gap: 9 mEq/L (ref 3–11)
BILIRUBIN TOTAL: 0.38 mg/dL (ref 0.20–1.20)
BUN: 21.7 mg/dL (ref 7.0–26.0)
CO2: 26 mEq/L (ref 22–29)
CREATININE: 0.8 mg/dL (ref 0.6–1.1)
Calcium: 9.3 mg/dL (ref 8.4–10.4)
Chloride: 103 mEq/L (ref 98–109)
EGFR: 82 mL/min/{1.73_m2} — ABNORMAL LOW (ref >90)
GLUCOSE: 101 mg/dL (ref 70–140)
Potassium: 3.7 mEq/L (ref 3.5–5.1)
SODIUM: 138 meq/L (ref 136–145)
TOTAL PROTEIN: 7.2 g/dL (ref 6.4–8.3)

## 2016-09-09 LAB — CBC & DIFF AND RETIC
BASO%: 0.8 % (ref 0.0–2.0)
BASOS ABS: 0.1 10*3/uL (ref 0.0–0.1)
EOS ABS: 0.2 10*3/uL (ref 0.0–0.5)
EOS%: 3.7 % (ref 0.0–7.0)
HCT: 37.1 % (ref 34.8–46.6)
HEMOGLOBIN: 12.5 g/dL (ref 11.6–15.9)
IMMATURE RETIC FRACT: 9.2 % (ref 1.60–10.00)
LYMPH%: 27.5 % (ref 14.0–49.7)
MCH: 28.7 pg (ref 25.1–34.0)
MCHC: 33.7 g/dL (ref 31.5–36.0)
MCV: 85.3 fL (ref 79.5–101.0)
MONO#: 0.4 10*3/uL (ref 0.1–0.9)
MONO%: 6.5 % (ref 0.0–14.0)
NEUT#: 3.8 10*3/uL (ref 1.5–6.5)
NEUT%: 61.5 % (ref 38.4–76.8)
NRBC: 0 % (ref 0–0)
Platelets: 227 10*3/uL (ref 145–400)
RBC: 4.35 10*6/uL (ref 3.70–5.45)
RDW: 13.6 % (ref 11.2–14.5)
Retic %: 1.78 % (ref 0.70–2.10)
Retic Ct Abs: 77.43 10*3/uL (ref 33.70–90.70)
WBC: 6.2 10*3/uL (ref 3.9–10.3)
lymph#: 1.7 10*3/uL (ref 0.9–3.3)

## 2016-09-09 LAB — LACTATE DEHYDROGENASE: LDH: 152 U/L (ref 125–245)

## 2016-09-15 ENCOUNTER — Ambulatory Visit (INDEPENDENT_AMBULATORY_CARE_PROVIDER_SITE_OTHER): Payer: Medicare HMO | Admitting: Orthopaedic Surgery

## 2016-09-15 ENCOUNTER — Ambulatory Visit (INDEPENDENT_AMBULATORY_CARE_PROVIDER_SITE_OTHER): Payer: Medicare HMO

## 2016-09-15 ENCOUNTER — Encounter (INDEPENDENT_AMBULATORY_CARE_PROVIDER_SITE_OTHER): Payer: Self-pay | Admitting: Orthopaedic Surgery

## 2016-09-15 DIAGNOSIS — M25571 Pain in right ankle and joints of right foot: Secondary | ICD-10-CM

## 2016-09-15 MED ORDER — METHYLPREDNISOLONE 4 MG PO TABS: ORAL_TABLET | ORAL | 0 refills | Status: DC

## 2016-09-15 MED ORDER — GABAPENTIN 100 MG PO CAPS: 100.0000 mg | ORAL_CAPSULE | Freq: Three times a day (TID) | ORAL | 0 refills | Status: DC

## 2016-09-15 MED ORDER — DICLOFENAC SODIUM 1 % TD GEL: 2.0000 g | Freq: Four times a day (QID) | TRANSDERMAL | 3 refills | Status: DC
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
## 2016-10-13 ENCOUNTER — Ambulatory Visit (INDEPENDENT_AMBULATORY_CARE_PROVIDER_SITE_OTHER): Payer: Medicare HMO | Admitting: Orthopaedic Surgery

## 2016-10-18 ENCOUNTER — Ambulatory Visit (INDEPENDENT_AMBULATORY_CARE_PROVIDER_SITE_OTHER): Payer: Medicare HMO | Admitting: Physician Assistant

## 2016-10-18 ENCOUNTER — Encounter (INDEPENDENT_AMBULATORY_CARE_PROVIDER_SITE_OTHER): Payer: Self-pay | Admitting: Physician Assistant

## 2016-10-18 VITALS — Ht 66.0 in | Wt 184.0 lb

## 2016-10-18 DIAGNOSIS — M25571 Pain in right ankle and joints of right foot: Secondary | ICD-10-CM

## 2016-10-18 MED ORDER — LIDOCAINE HCL 1 % IJ SOLN
2.0000 mL | INTRAMUSCULAR | Status: AC | PRN
  Administered 2016-10-18: 2 mL

## 2016-10-18 MED ORDER — METHYLPREDNISOLONE ACETATE 40 MG/ML IJ SUSP
40.0000 mg | INTRAMUSCULAR | Status: AC | PRN
  Administered 2016-10-18: 40 mg via INTRA_ARTICULAR

## 2016-10-18 NOTE — Progress Notes (Signed)
   Office Visit Note   Patient: Priscilla Higgins           Date of Birth: [birth year]           MRN: [id number] Visit Date: 10/18/2016              Requested by: Priscilla Fear, [place] [address] PCP: [place]: Visit Diagnoses:  1. Pain in right ankle and joints of right foot     Plan: Follow up in a month check progress lack of. Prescription for physical therapy for range of motion strengthening and modalities to the right ankle.  Follow-Up Instructions: Return in about 4 weeks (around 11/15/2016).   Orders:  Orders Placed This Encounter  Procedures  . Medium Joint Injection/Arthrocentesis   No orders of the defined types were placed in this encounter.     Procedures: Medium Joint Inj Date/Time: 10/18/2016 3:51 PM Performed by: Priscilla Higgins Authorized by: Priscilla Higgins   Consent Given by:  Patient Indications:  Pain Location:  Ankle Site:  R ankle Needle Size:  25 G Needle Length:  1.5 inches Approach:  Anterolateral Medications:  2 mL lidocaine 1 %; 40 mg methylPREDNISolone acetate 40 MG/ML Aspiration Attempted: No       Clinical Data: No additional findings.   Subjective: Chief Complaint  Patient presents with  . Right Ankle - Follow-up  . Follow-up    HPI Priscilla Higgins returns today stating that she still having severe ankle pain. States that the gabapentin helps but she was having some gastric upset with this guide this medication for gastric upset discontinued with the gabapentin.. She is very frustrated that she still having pain feels as if something shifts within the ankle. Has occasional giving way of the ankle Review of Systems   Objective: Vital Signs: Ht 5\' 6"  (1.676 m)   Wt 184 lb (83.5 kg)   BMI 29.70 kg/m   Physical Exam  Ortho Exam And ankle she is able to come to neutral and plantar flexion of approximately 20. Inversion eversion of the ankle she has 5 out of 5 strengths. Global  tenderness about the ankle. Slight edema of the ankle. Status post right ankle injection she has diminished pain in the ankle with ambulation. Specialty Comments:  No specialty comments available.  Imaging: No results found.   PMFS History: Patient Active Problem List   Diagnosis Date Noted  . 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 Dislocation of right subtalar joint 03/17/2016  . 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 Back pain   . Cancer ([place])   . Depressed   . Hypertension   . Tibia fracture 03/17/2016    No family history on file.  Past Surgical History:  Procedure Laterality Date  . ABDOMINAL HYSTERECTOMY    . BACK SURGERY     x4   . CERVICAL FUSION    . CLOSED REDUCTION ANKLE FRACTURE Right 03/17/2016   Social History   Occupational History  . Not on file.   Social History Main Topics  . Smoking status: Never Smoker  . Smokeless tobacco: Never Used  . Alcohol use No  . Drug use: No  . Sexual activity: Not on file

## 2016-10-21 NOTE — Progress Notes (Signed)
error 

## 2016-10-22 ENCOUNTER — Other Ambulatory Visit (INDEPENDENT_AMBULATORY_CARE_PROVIDER_SITE_OTHER): Payer: Self-pay | Admitting: Orthopaedic Surgery

## 2016-10-22 NOTE — Telephone Encounter (Signed)
Pt calling about RX gabapentin, robaxin and hydrocodone.

## 2016-10-22 NOTE — Telephone Encounter (Signed)
Please advise 

## 2016-10-26 ENCOUNTER — Telehealth (INDEPENDENT_AMBULATORY_CARE_PROVIDER_SITE_OTHER): Payer: Self-pay | Admitting: *Deleted

## 2016-10-26 ENCOUNTER — Ambulatory Visit: Payer: Medicare HMO

## 2016-10-26 ENCOUNTER — Telehealth: Payer: Self-pay | Admitting: Radiation Oncology

## 2016-10-26 ENCOUNTER — Ambulatory Visit
Admission: RE | Admit: 2016-10-26 | Discharge: 2016-10-26 | Disposition: A | Discharge status: Home / Self Care | Payer: Medicare HMO | Source: Ambulatory Visit | Attending: Radiation Oncology | Admitting: Radiation Oncology

## 2016-10-26 NOTE — Telephone Encounter (Signed)
Received fax from [facility] with appointment for pt on 10/26/16 at 10am, they will fax copy of eval findings and a plan of care for review once therapy is completed.

## 2016-10-26 NOTE — Telephone Encounter (Signed)
  I reviewed the new path report from Dr Ubaldo Glassing which shows a new follicular lymphomatous lesion at the right temple.  I spoke with the patient by phone, and per our conversation, if she gets tx to the new skin lesion, she wants her RT at [facility] .  We will cancel her consult here and refer her up to see Dr. Lianne Cure for a re consultation (she was previously treated at [facility] as well).  -----------------------------------  Eppie Gibson, MD

## 2016-10-29 ENCOUNTER — Ambulatory Visit: Payer: Medicare HMO | Admitting: Radiation Oncology

## 2016-10-29 ENCOUNTER — Ambulatory Visit: Payer: Medicare HMO

## 2016-11-16 ENCOUNTER — Ambulatory Visit (INDEPENDENT_AMBULATORY_CARE_PROVIDER_SITE_OTHER): Payer: Medicare HMO | Admitting: Orthopaedic Surgery

## 2016-11-16 DIAGNOSIS — M7062 Trochanteric bursitis, left hip: Secondary | ICD-10-CM | POA: Diagnosis not present

## 2016-11-16 DIAGNOSIS — S93314S Dislocation of tarsal joint of right foot, sequela: Secondary | ICD-10-CM | POA: Diagnosis not present
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
## 2016-12-02 ENCOUNTER — Other Ambulatory Visit (INDEPENDENT_AMBULATORY_CARE_PROVIDER_SITE_OTHER): Payer: Self-pay | Admitting: Orthopaedic Surgery

## 2016-12-03 NOTE — Telephone Encounter (Signed)
Please advise 

## 2016-12-14 ENCOUNTER — Ambulatory Visit (INDEPENDENT_AMBULATORY_CARE_PROVIDER_SITE_OTHER): Payer: Medicare HMO | Admitting: Orthopaedic Surgery

## 2016-12-14 DIAGNOSIS — S93314S Dislocation of tarsal joint of right foot, sequela: Secondary | ICD-10-CM | POA: Diagnosis not present

## 2016-12-14 NOTE — Progress Notes (Signed)
The patient is now 9 months status post a traumatic subtalar dislocation of her right ankle with a talus fracture. She's been going to physical therapy working on her ankle but also her back. She said her range of motion is improved greatly with the right ankle and her hips been doing better as well. She has some bursitis of her left hip. She still having bad spasms of her lumbar spine and she has some chronic pain issues with that but overall therapies helped her she like to have this therapy transitioned for another 4 weeks which think extending of 4 weeks of the great.  On examination her right ankle and subtalar joint are still swollen but there is no redness. His notes infection. Her inversion and eversion as well as flexion-extension have increased.  At this point she will continue increase her activities as he tolerates. I gave her a note to give physical therapy extender for 4 more weeks. I like see her back in 3 months at that visit I would like an AP lateral and mortise views of her right ankle.

## 2016-12-15 ENCOUNTER — Telehealth (INDEPENDENT_AMBULATORY_CARE_PROVIDER_SITE_OTHER): Payer: Self-pay | Admitting: *Deleted

## 2017-01-14 ENCOUNTER — Other Ambulatory Visit (INDEPENDENT_AMBULATORY_CARE_PROVIDER_SITE_OTHER): Payer: Self-pay | Admitting: Orthopaedic Surgery

## 2017-01-14 NOTE — Telephone Encounter (Signed)
Please advise 

## 2017-01-21 ENCOUNTER — Other Ambulatory Visit (INDEPENDENT_AMBULATORY_CARE_PROVIDER_SITE_OTHER): Payer: Self-pay | Admitting: Orthopaedic Surgery

## 2017-01-24 NOTE — Telephone Encounter (Signed)
Please advise 

## 2017-03-01 ENCOUNTER — Other Ambulatory Visit (INDEPENDENT_AMBULATORY_CARE_PROVIDER_SITE_OTHER): Payer: Self-pay | Admitting: Orthopaedic Surgery

## 2017-03-02 NOTE — Telephone Encounter (Signed)
Please advise 

## 2017-03-16 ENCOUNTER — Ambulatory Visit (INDEPENDENT_AMBULATORY_CARE_PROVIDER_SITE_OTHER): Payer: Medicare HMO | Admitting: Orthopaedic Surgery

## 2017-03-16 ENCOUNTER — Encounter (INDEPENDENT_AMBULATORY_CARE_PROVIDER_SITE_OTHER): Payer: Self-pay | Admitting: Orthopaedic Surgery

## 2017-03-16 ENCOUNTER — Ambulatory Visit (INDEPENDENT_AMBULATORY_CARE_PROVIDER_SITE_OTHER): Payer: Medicare HMO

## 2017-03-16 VITALS — Ht 66.0 in | Wt 184.0 lb

## 2017-03-16 DIAGNOSIS — S93314S Dislocation of tarsal joint of right foot, sequela: Secondary | ICD-10-CM

## 2017-03-16 DIAGNOSIS — M25571 Pain in right ankle and joints of right foot: Secondary | ICD-10-CM

## 2017-03-16 MED ORDER — TRAMADOL HCL 50 MG PO TABS: 50.0000 mg | ORAL_TABLET | Freq: Four times a day (QID) | ORAL | 0 refills | Status: DC | PRN
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
## 2017-03-18 ENCOUNTER — Encounter (INDEPENDENT_AMBULATORY_CARE_PROVIDER_SITE_OTHER): Payer: Self-pay | Admitting: Orthopedic Surgery

## 2017-03-18 ENCOUNTER — Ambulatory Visit (INDEPENDENT_AMBULATORY_CARE_PROVIDER_SITE_OTHER): Payer: Medicare HMO | Admitting: Orthopedic Surgery

## 2017-03-18 VITALS — Ht 66.0 in | Wt 184.0 lb

## 2017-03-18 DIAGNOSIS — S93314S Dislocation of tarsal joint of right foot, sequela: Secondary | ICD-10-CM | POA: Diagnosis not present

## 2017-03-18 NOTE — Progress Notes (Signed)
Office Visit Note   Patient: Priscilla Higgins           Date of Birth: 09-11-1961           MRN: [id number] Visit Date: 03/18/2017              Requested by: Emelda Fear, [place] [address] PCP: Emelda Fear, DO  Chief Complaint  Patient presents with  . 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       HPI: Patient is a 56 year old woman who is status post subtalar dislocation right ankle. 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 Dislocation approximately one year ago. Patient states that she has fallen due to ankle pain she states her ankle is painful unstable and cannot perform activities of daily living due to pain.  Assessment & Plan: Visit Diagnoses:  1. Closed traumatic dislocation of right subtalar joint, sequela     Plan: Recommended proceeding with tibial calcaneal fusion. We'll need to take down the subtalar joint as well as the ankle joint. We will need to place screws posterior to anteriorly stabilize the collapse of the talar neck which is secondary to the avascular necrosis. Risks and benefits were discussed including infection neurovascular injury limping arthritis need for additional surgery. Patient states she understands wish to proceed at this time.  Follow-Up Instructions: Return in about 1 week (around 03/25/2017).   Ortho Exam  Patient is alert, oriented, no adenopathy, well-dressed, normal affect, normal respiratory effort. On examination patient has a palpable dorsalis pedis pulse. She has pain with attempted range of motion of the ankle and subtalar joint she is tender to palpation in the sinus Tarsi she is tender to palpation over the ankle joint. The talonavicular joint is not tender to palpation. There is no redness no cellulitis no signs of infection. Patient does not have diabetes does not  smoke.  Imaging: No results found.  Labs: No results found for: HGBA1C, ESRSEDRATE, CRP, LABURIC, REPTSTATUS, GRAMSTAIN, CULT, LABORGA  Orders:  No orders of the defined types were placed in this encounter.  No orders of the defined types were placed in this encounter.    Procedures: No procedures performed  Clinical Data: No additional findings.  ROS:  All other systems negative, except as noted in the HPI. Review of Systems  Objective: Vital Signs: Ht 5\' 6"  (1.676 m)   Wt 184 lb (83.5 kg)   BMI 29.70 kg/m   Specialty Comments:  No specialty comments available.  PMFS History: Patient Active Problem List   Diagnosis Date Noted  . 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 Dislocation of right subtalar joint 03/17/2016  . 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 Back pain   . Cancer ([place])   . Depressed   . Hypertension   . Tibia fracture 03/17/2016    No family history on file.  Past Surgical History:  Procedure Laterality Date  . ABDOMINAL HYSTERECTOMY    . BACK SURGERY     x4   . CERVICAL FUSION    . CLOSED REDUCTION ANKLE FRACTURE Right 03/17/2016   Social History   Occupational History  . Not on file.   Social History Main Topics  . Smoking status: Never Smoker  . Smokeless  tobacco: Never Used  . Alcohol use No  . Drug use: No  . Sexual activity: Not on file

## 2017-03-25 ENCOUNTER — Telehealth (INDEPENDENT_AMBULATORY_CARE_PROVIDER_SITE_OTHER): Payer: Self-pay | Admitting: Orthopedic Surgery

## 2017-03-25 NOTE — Telephone Encounter (Signed)
Returned call to patient left message to call back [phone]

## 2017-04-04 ENCOUNTER — Other Ambulatory Visit (INDEPENDENT_AMBULATORY_CARE_PROVIDER_SITE_OTHER): Payer: Self-pay | Admitting: Family

## 2017-04-07 ENCOUNTER — Encounter (HOSPITAL_COMMUNITY): Payer: Self-pay | Admitting: *Deleted

## 2017-04-07 NOTE — Progress Notes (Signed)
Pt denies SOB, chest pain, and being under the care of a cardiologist. Pt denies having a cardiac cath but stated that an echo was performed > 30 years ago and a stress test > 10 years ago. Pt denies having an EKG within the last year but stated that a chest x ray was done at either [facility] or [facility]; records requested. Pt made aware to stop taking  Aspirin, vitamins, fish oil and herbal medications. Do not take any NSAIDs ie: Ibuprofen, Advil, Naproxen (Aleve) , Motrin, Voltaren, BC and Goody Powder or any medication containing Aspirin. Pt verbalized understanding of all pre-op instructions.

## 2017-04-08 ENCOUNTER — Encounter (HOSPITAL_COMMUNITY)
Admission: RE | Disposition: A | Discharge status: Home / Self Care | Payer: Self-pay | Source: Ambulatory Visit | Attending: Orthopedic Surgery

## 2017-04-08 ENCOUNTER — Ambulatory Visit (HOSPITAL_COMMUNITY): Payer: Medicare HMO | Admitting: Certified Registered Nurse Anesthetist

## 2017-04-08 ENCOUNTER — Ambulatory Visit (HOSPITAL_COMMUNITY)
Admission: RE | Admit: 2017-04-08 | Discharge: 2017-04-09 | Disposition: A | Discharge status: Home / Self Care | Payer: Medicare HMO | Source: Ambulatory Visit | Attending: Orthopedic Surgery | Admitting: Orthopedic Surgery

## 2017-04-08 ENCOUNTER — Encounter (HOSPITAL_COMMUNITY): Payer: Self-pay | Admitting: Certified Registered Nurse Anesthetist

## 2017-04-08 DIAGNOSIS — M12571 Traumatic arthropathy, right ankle and foot: Secondary | ICD-10-CM | POA: Insufficient documentation

## 2017-04-08 DIAGNOSIS — Z981 Arthrodesis status: Secondary | ICD-10-CM

## 2017-04-08 DIAGNOSIS — I10 Essential (primary) hypertension: Secondary | ICD-10-CM | POA: Diagnosis not present

## 2017-04-08 DIAGNOSIS — M6281 Muscle weakness (generalized): Secondary | ICD-10-CM | POA: Diagnosis not present

## 2017-04-08 DIAGNOSIS — Z91040 Latex allergy status: Secondary | ICD-10-CM | POA: Insufficient documentation

## 2017-04-08 DIAGNOSIS — S92111S Displaced fracture of neck of right talus, sequela: Secondary | ICD-10-CM | POA: Diagnosis not present

## 2017-04-08 DIAGNOSIS — K219 Gastro-esophageal reflux disease without esophagitis: Secondary | ICD-10-CM | POA: Insufficient documentation

## 2017-04-08 DIAGNOSIS — R2681 Unsteadiness on feet: Secondary | ICD-10-CM | POA: Diagnosis not present

## 2017-04-08 DIAGNOSIS — M87071 Idiopathic aseptic necrosis of right ankle: Secondary | ICD-10-CM

## 2017-04-08 DIAGNOSIS — X58XXXS Exposure to other specified factors, sequela: Secondary | ICD-10-CM | POA: Insufficient documentation

## 2017-04-08 DIAGNOSIS — F419 Anxiety disorder, unspecified: Secondary | ICD-10-CM | POA: Diagnosis not present

## 2017-04-08 DIAGNOSIS — S93314S Dislocation of tarsal joint of right foot, sequela: Secondary | ICD-10-CM

## 2017-04-08 DIAGNOSIS — Z882 Allergy status to sulfonamides status: Secondary | ICD-10-CM | POA: Insufficient documentation

## 2017-04-08 DIAGNOSIS — M879 Osteonecrosis, unspecified: Secondary | ICD-10-CM | POA: Diagnosis present

## 2017-04-08 HISTORY — PX: ANKLE FUSION: SHX881

## 2017-04-08 HISTORY — DX: Cardiac murmur, unspecified: R01.1

## 2017-04-08 HISTORY — DX: Idiopathic aseptic necrosis of unspecified bone: M87.00

## 2017-04-08 HISTORY — DX: Personal history of other medical treatment: Z92.89

## 2017-04-08 HISTORY — DX: Other chronic pain: G89.29

## 2017-04-08 HISTORY — DX: Unspecified osteoarthritis, unspecified site: M19.90

## 2017-04-08 HISTORY — DX: Anxiety disorder, unspecified: F41.9

## 2017-04-08 HISTORY — DX: Gastro-esophageal reflux disease without esophagitis: K21.9

## 2017-04-08 HISTORY — DX: Other specified postprocedural states: Z98.890

## 2017-04-08 HISTORY — DX: Follicular lymphoma, unspecified, unspecified site: C82.90

## 2017-04-08 HISTORY — DX: Other specified postprocedural states: R11.2

## 2017-04-08 HISTORY — DX: Dorsalgia, unspecified: M54.9

## 2017-04-08 HISTORY — PX: ANKLE FUSION: SHX5718

## 2017-04-08 LAB — CBC
HEMATOCRIT: 37.2 % (ref 36.0–46.0)
HEMOGLOBIN: 12.3 g/dL (ref 12.0–15.0)
MCH: 28.9 pg (ref 26.0–34.0)
MCHC: 33.1 g/dL (ref 30.0–36.0)
MCV: 87.5 fL (ref 78.0–100.0)
Platelets: 196 10*3/uL (ref 150–400)
RBC: 4.25 MIL/uL (ref 3.87–5.11)
RDW: 13.4 % (ref 11.5–15.5)
WBC: 5.9 10*3/uL (ref 4.0–10.5)

## 2017-04-08 LAB — BASIC METABOLIC PANEL
ANION GAP: 8 (ref 5–15)
BUN: 21 mg/dL — AB (ref 6–20)
CHLORIDE: 102 mmol/L (ref 101–111)
CO2: 25 mmol/L (ref 22–32)
Calcium: 9.2 mg/dL (ref 8.9–10.3)
Creatinine, Ser: 0.8 mg/dL (ref 0.44–1.00)
GFR calc Af Amer: >60 mL/min (ref >60)
GLUCOSE: 101 mg/dL — AB (ref 65–99)
POTASSIUM: 3.3 mmol/L — AB (ref 3.5–5.1)
Sodium: 135 mmol/L (ref 135–145)

## 2017-04-08 SURGERY — ANKLE FUSION
Anesthesia: General | Laterality: Right

## 2017-04-08 MED ORDER — CHLORTHALIDONE 25 MG PO TABS: 25.0000 mg | ORAL_TABLET | Freq: Every day | ORAL | Status: DC

## 2017-04-08 MED ORDER — KETOROLAC TROMETHAMINE 15 MG/ML IJ SOLN
INTRAMUSCULAR | Status: AC
  Administered 2017-04-08: 15 mg via INTRAVENOUS
  Filled 2017-04-08: qty 1

## 2017-04-08 MED ORDER — PROPOFOL 10 MG/ML IV BOLUS
INTRAVENOUS | Status: AC
  Filled 2017-04-08: qty 20

## 2017-04-08 MED ORDER — PANTOPRAZOLE SODIUM 40 MG PO TBEC
40.0000 mg | DELAYED_RELEASE_TABLET | Freq: Every day | ORAL | Status: DC
  Administered 2017-04-09: 40 mg via ORAL
  Filled 2017-04-08: qty 1

## 2017-04-08 MED ORDER — POLYETHYLENE GLYCOL 3350 17 G PO PACK: 17.0000 g | PACK | Freq: Every day | ORAL | Status: DC | PRN

## 2017-04-08 MED ORDER — FENTANYL CITRATE (PF) 250 MCG/5ML IJ SOLN
INTRAMUSCULAR | Status: AC
  Filled 2017-04-08: qty 5

## 2017-04-08 MED ORDER — SODIUM CHLORIDE 0.9 % IV SOLN: INTRAVENOUS | Status: DC

## 2017-04-08 MED ORDER — LACTATED RINGERS IV SOLN
INTRAVENOUS | Status: DC
  Administered 2017-04-08: 09:00:00 via INTRAVENOUS

## 2017-04-08 MED ORDER — BISACODYL 10 MG RE SUPP
10.0000 mg | Freq: Every day | RECTAL | Status: DC | PRN
Start: 2017-04-08 — End: 2017-04-09

## 2017-04-08 MED ORDER — HYDROMORPHONE HCL 1 MG/ML IJ SOLN: 0.2500 mg | INTRAMUSCULAR | Status: DC | PRN

## 2017-04-08 MED ORDER — ONDANSETRON HCL 4 MG/2ML IJ SOLN
INTRAMUSCULAR | Status: DC | PRN
  Administered 2017-04-08: 4 mg via INTRAVENOUS

## 2017-04-08 MED ORDER — ALPRAZOLAM 0.5 MG PO TABS
1.0000 mg | ORAL_TABLET | Freq: Every day | ORAL | Status: DC
  Administered 2017-04-08: 1 mg via ORAL
  Filled 2017-04-08: qty 2

## 2017-04-08 MED ORDER — LOSARTAN POTASSIUM 50 MG PO TABS
100.0000 mg | ORAL_TABLET | Freq: Every day | ORAL | Status: DC
  Administered 2017-04-08 – 2017-04-09 (×2): 100 mg via ORAL
  Filled 2017-04-08 (×2): qty 2

## 2017-04-08 MED ORDER — CHLORHEXIDINE GLUCONATE 4 % EX LIQD: 60.0000 mL | Freq: Once | CUTANEOUS | Status: DC

## 2017-04-08 MED ORDER — OXYCODONE HCL 5 MG PO TABS
5.0000 mg | ORAL_TABLET | ORAL | Status: DC | PRN
  Administered 2017-04-08 – 2017-04-09 (×2): 5 mg via ORAL
  Filled 2017-04-08 (×2): qty 1

## 2017-04-08 MED ORDER — ONDANSETRON HCL 4 MG PO TABS: 4.0000 mg | ORAL_TABLET | Freq: Four times a day (QID) | ORAL | Status: DC | PRN

## 2017-04-08 MED ORDER — FENTANYL CITRATE (PF) 100 MCG/2ML IJ SOLN
50.0000 ug | Freq: Once | INTRAMUSCULAR | Status: AC
  Administered 2017-04-08: 50 ug via INTRAVENOUS

## 2017-04-08 MED ORDER — ONDANSETRON HCL 4 MG/2ML IJ SOLN: 4.0000 mg | Freq: Four times a day (QID) | INTRAMUSCULAR | Status: DC | PRN

## 2017-04-08 MED ORDER — MIDAZOLAM HCL 2 MG/2ML IJ SOLN
INTRAMUSCULAR | Status: AC
  Filled 2017-04-08: qty 2

## 2017-04-08 MED ORDER — FENTANYL CITRATE (PF) 100 MCG/2ML IJ SOLN
INTRAMUSCULAR | Status: DC | PRN
  Administered 2017-04-08: 100 ug via INTRAVENOUS

## 2017-04-08 MED ORDER — ROCURONIUM BROMIDE 100 MG/10ML IV SOLN
INTRAVENOUS | Status: DC | PRN
  Administered 2017-04-08: 40 mg via INTRAVENOUS

## 2017-04-08 MED ORDER — GABAPENTIN 300 MG PO CAPS
300.0000 mg | ORAL_CAPSULE | Freq: Three times a day (TID) | ORAL | Status: DC
  Administered 2017-04-08 – 2017-04-09 (×3): 300 mg via ORAL
  Filled 2017-04-08 (×4): qty 1

## 2017-04-08 MED ORDER — AMITRIPTYLINE HCL 25 MG PO TABS
25.0000 mg | ORAL_TABLET | Freq: Every day | ORAL | Status: DC
  Administered 2017-04-08: 25 mg via ORAL
  Filled 2017-04-08: qty 1

## 2017-04-08 MED ORDER — ALPRAZOLAM 0.5 MG PO TABS: 1.0000 mg | ORAL_TABLET | Freq: Every day | ORAL | Status: DC | PRN

## 2017-04-08 MED ORDER — HYDROCHLOROTHIAZIDE 25 MG PO TABS
25.0000 mg | ORAL_TABLET | Freq: Every day | ORAL | Status: DC
  Administered 2017-04-08 – 2017-04-09 (×2): 25 mg via ORAL
  Filled 2017-04-08 (×2): qty 1

## 2017-04-08 MED ORDER — ACETAMINOPHEN 325 MG PO TABS: 650.0000 mg | ORAL_TABLET | Freq: Four times a day (QID) | ORAL | Status: DC | PRN

## 2017-04-08 MED ORDER — KETOROLAC TROMETHAMINE 15 MG/ML IJ SOLN
15.0000 mg | Freq: Four times a day (QID) | INTRAMUSCULAR | Status: AC
  Administered 2017-04-08 – 2017-04-09 (×4): 15 mg via INTRAVENOUS
  Filled 2017-04-08 (×3): qty 1

## 2017-04-08 MED ORDER — OXYCODONE HCL 5 MG/5ML PO SOLN
5.0000 mg | Freq: Once | ORAL | Status: DC | PRN
Start: 2017-04-08 — End: 2017-04-08

## 2017-04-08 MED ORDER — LOSARTAN POTASSIUM-HCTZ 100-25 MG PO TABS: 1.0000 | ORAL_TABLET | Freq: Every day | ORAL | Status: DC

## 2017-04-08 MED ORDER — DOCUSATE SODIUM 100 MG PO CAPS
100.0000 mg | ORAL_CAPSULE | Freq: Two times a day (BID) | ORAL | Status: DC
  Administered 2017-04-08 – 2017-04-09 (×3): 100 mg via ORAL
  Filled 2017-04-08 (×2): qty 1

## 2017-04-08 MED ORDER — OXYCODONE-ACETAMINOPHEN 5-325 MG PO TABS: 1.0000 | ORAL_TABLET | ORAL | 0 refills | Status: DC | PRN

## 2017-04-08 MED ORDER — MAGNESIUM CITRATE PO SOLN
1.0000 | Freq: Once | ORAL | Status: DC | PRN
Start: 2017-04-08 — End: 2017-04-09

## 2017-04-08 MED ORDER — METOCLOPRAMIDE HCL 5 MG/ML IJ SOLN
INTRAMUSCULAR | Status: AC
  Administered 2017-04-08: 10 mg via INTRAVENOUS
  Filled 2017-04-08: qty 2

## 2017-04-08 MED ORDER — OXYCODONE HCL 5 MG PO TABS: 5.0000 mg | ORAL_TABLET | Freq: Once | ORAL | Status: DC | PRN

## 2017-04-08 MED ORDER — PROPOFOL 10 MG/ML IV BOLUS
INTRAVENOUS | Status: DC | PRN
  Administered 2017-04-08: 150 mg via INTRAVENOUS

## 2017-04-08 MED ORDER — METHOCARBAMOL 1000 MG/10ML IJ SOLN
500.0000 mg | Freq: Four times a day (QID) | INTRAVENOUS | Status: DC | PRN
  Filled 2017-04-08: qty 5

## 2017-04-08 MED ORDER — ALPRAZOLAM 0.5 MG PO TABS: 1.0000 mg | ORAL_TABLET | ORAL | Status: DC

## 2017-04-08 MED ORDER — METHOCARBAMOL 500 MG PO TABS: 500.0000 mg | ORAL_TABLET | Freq: Four times a day (QID) | ORAL | Status: DC | PRN

## 2017-04-08 MED ORDER — CEFAZOLIN SODIUM-DEXTROSE 2-4 GM/100ML-% IV SOLN
2.0000 g | INTRAVENOUS | Status: AC
  Administered 2017-04-08: 2 g via INTRAVENOUS
  Filled 2017-04-08: qty 100

## 2017-04-08 MED ORDER — MIDAZOLAM HCL 2 MG/2ML IJ SOLN
INTRAMUSCULAR | Status: AC
  Administered 2017-04-08: 2 mg via INTRAVENOUS
  Filled 2017-04-08: qty 2

## 2017-04-08 MED ORDER — MIDAZOLAM HCL 2 MG/2ML IJ SOLN
2.0000 mg | Freq: Once | INTRAMUSCULAR | Status: AC
  Administered 2017-04-08: 2 mg via INTRAVENOUS

## 2017-04-08 MED ORDER — ACETAMINOPHEN 650 MG RE SUPP: 650.0000 mg | Freq: Four times a day (QID) | RECTAL | Status: DC | PRN

## 2017-04-08 MED ORDER — PHENYLEPHRINE HCL 10 MG/ML IJ SOLN
INTRAVENOUS | Status: DC | PRN
  Administered 2017-04-08: 40 ug/min via INTRAVENOUS

## 2017-04-08 MED ORDER — EPHEDRINE SULFATE 50 MG/ML IJ SOLN
INTRAMUSCULAR | Status: DC | PRN
  Administered 2017-04-08 (×2): 10 mg via INTRAVENOUS

## 2017-04-08 MED ORDER — METOCLOPRAMIDE HCL 5 MG PO TABS: 5.0000 mg | ORAL_TABLET | Freq: Three times a day (TID) | ORAL | Status: DC | PRN

## 2017-04-08 MED ORDER — LIDOCAINE HCL (CARDIAC) 20 MG/ML IV SOLN
INTRAVENOUS | Status: DC | PRN
  Administered 2017-04-08: 70 mg via INTRAVENOUS

## 2017-04-08 MED ORDER — SCOPOLAMINE 1 MG/3DAYS TD PT72
MEDICATED_PATCH | TRANSDERMAL | Status: DC | PRN
  Administered 2017-04-08: 1 via TRANSDERMAL

## 2017-04-08 MED ORDER — FENTANYL CITRATE (PF) 100 MCG/2ML IJ SOLN
INTRAMUSCULAR | Status: AC
  Administered 2017-04-08: 50 ug via INTRAVENOUS
  Filled 2017-04-08: qty 2

## 2017-04-08 MED ORDER — CEFAZOLIN SODIUM-DEXTROSE 1-4 GM/50ML-% IV SOLN
1.0000 g | Freq: Four times a day (QID) | INTRAVENOUS | Status: AC
  Administered 2017-04-08 – 2017-04-09 (×3): 1 g via INTRAVENOUS
  Filled 2017-04-08 (×3): qty 50

## 2017-04-08 MED ORDER — BUPIVACAINE-EPINEPHRINE (PF) 0.5% -1:200000 IJ SOLN
INTRAMUSCULAR | Status: DC | PRN
  Administered 2017-04-08: 15 mL via PERINEURAL
  Administered 2017-04-08: 25 mL via PERINEURAL

## 2017-04-08 MED ORDER — METOCLOPRAMIDE HCL 5 MG/ML IJ SOLN
5.0000 mg | Freq: Three times a day (TID) | INTRAMUSCULAR | Status: DC | PRN
  Administered 2017-04-08: 10 mg via INTRAVENOUS

## 2017-04-08 MED ORDER — FLUOXETINE HCL 20 MG PO CAPS
40.0000 mg | ORAL_CAPSULE | Freq: Every day | ORAL | Status: DC
  Administered 2017-04-09: 40 mg via ORAL
  Filled 2017-04-08: qty 2

## 2017-04-08 MED ORDER — SUGAMMADEX SODIUM 200 MG/2ML IV SOLN
INTRAVENOUS | Status: DC | PRN
  Administered 2017-04-08: 200 mg via INTRAVENOUS

## 2017-04-08 MED ORDER — DEXAMETHASONE SODIUM PHOSPHATE 10 MG/ML IJ SOLN
INTRAMUSCULAR | Status: DC | PRN
  Administered 2017-04-08: 10 mg via INTRAVENOUS

## 2017-04-08 MED ORDER — 0.9 % SODIUM CHLORIDE (POUR BTL) OPTIME
TOPICAL | Status: DC | PRN
  Administered 2017-04-08: 1000 mL

## 2017-04-08 MED ORDER — HYDROMORPHONE HCL 1 MG/ML IJ SOLN: 1.0000 mg | INTRAMUSCULAR | Status: DC | PRN

## 2017-04-08 SURGICAL SUPPLY — 54 items
BANDAGE ESMARK 6X9 LF (GAUZE/BANDAGES/DRESSINGS) ×1 IMPLANT
BANDAGE GAUZE 4  KLING STR (GAUZE/BANDAGES/DRESSINGS) ×3 IMPLANT
BIT DRILL CALIBRATED 4.2 (BIT) ×1 IMPLANT
BIT DRILL CALIBRATED 5.0 MM (BIT) ×1 IMPLANT
BIT DRILL CANNULATED 13.0X300M (BIT) ×1 IMPLANT
BLADE SAW SGTL HD 18.5X60.5X1. (BLADE) ×3 IMPLANT
BLADE SURG 10 STRL SS (BLADE) IMPLANT
BNDG COHESIVE 4X5 TAN STRL (GAUZE/BANDAGES/DRESSINGS) ×3 IMPLANT
BNDG ESMARK 6X9 LF (GAUZE/BANDAGES/DRESSINGS) ×3
BNDG GAUZE ELAST 4 BULKY (GAUZE/BANDAGES/DRESSINGS) ×6 IMPLANT
CAP END 0MM TI T25 STARDV (Cap) ×1 IMPLANT
COVER MAYO STAND STRL (DRAPES) IMPLANT
COVER SURGICAL LIGHT HANDLE (MISCELLANEOUS) ×6 IMPLANT
DRAPE OEC MINIVIEW 54X84 (DRAPES) IMPLANT
DRAPE U-SHAPE 47X51 STRL (DRAPES) ×3 IMPLANT
DRILL BIT CALIBRATED 4.2 (BIT) ×3
DRILL BIT CALIBRATED 5.0 MM (BIT) ×3
DRILL BIT CANNULATED 13.0X300M (BIT) ×3
DRSG ADAPTIC 3X8 NADH LF (GAUZE/BANDAGES/DRESSINGS) ×3 IMPLANT
DRSG PAD ABDOMINAL 8X10 ST (GAUZE/BANDAGES/DRESSINGS) ×3 IMPLANT
DURAPREP 26ML APPLICATOR (WOUND CARE) ×3 IMPLANT
ELECT REM PT RETURN 9FT ADLT (ELECTROSURGICAL) ×3
ELECTRODE REM PT RTRN 9FT ADLT (ELECTROSURGICAL) ×1 IMPLANT
ENDCAP TI (Cap) ×2 IMPLANT
GAUZE SPONGE 4X4 12PLY STRL (GAUZE/BANDAGES/DRESSINGS) ×3 IMPLANT
GAUZE SPONGE 4X4 12PLY STRL LF (GAUZE/BANDAGES/DRESSINGS) ×3 IMPLANT
GLOVE BIOGEL PI IND STRL 9 (GLOVE) ×1 IMPLANT
GLOVE BIOGEL PI INDICATOR 9 (GLOVE) ×2
GLOVE SURG ORTHO 9.0 STRL STRW (GLOVE) ×3 IMPLANT
GOWN STRL REUS W/ TWL LRG LVL3 (GOWN DISPOSABLE) ×1 IMPLANT
GOWN STRL REUS W/ TWL XL LVL3 (GOWN DISPOSABLE) ×1 IMPLANT
GOWN STRL REUS W/TWL LRG LVL3 (GOWN DISPOSABLE) ×2
GOWN STRL REUS W/TWL XL LVL3 (GOWN DISPOSABLE) ×2
GUIDEWIRE 3.2X290 (WIRE) ×3 IMPLANT
KIT BASIN OR (CUSTOM PROCEDURE TRAY) ×3 IMPLANT
KIT ROOM TURNOVER OR (KITS) ×3 IMPLANT
NAIL EX CANN ARTHRODESIS 10MM (Nail) ×3 IMPLANT
NS IRRIG 1000ML POUR BTL (IV SOLUTION) ×3 IMPLANT
PACK ORTHO EXTREMITY (CUSTOM PROCEDURE TRAY) ×3 IMPLANT
PAD ARMBOARD 7.5X6 YLW CONV (MISCELLANEOUS) ×6 IMPLANT
REAMER ROD DEEP FLUTE 2.5X950 (INSTRUMENTS) ×3 IMPLANT
SCREW LOCK STAR 5X28 (Screw) ×3 IMPLANT
SCREW LOCK STAR 6X68 (Screw) ×3 IMPLANT
SCREW LOCK STAR 6X72 (Screw) ×3 IMPLANT
SPONGE LAP 18X18 X RAY DECT (DISPOSABLE) ×6 IMPLANT
SUCTION FRAZIER HANDLE 10FR (MISCELLANEOUS) ×2
SUCTION TUBE FRAZIER 10FR DISP (MISCELLANEOUS) ×1 IMPLANT
SUT ETHILON 2 0 PSLX (SUTURE) ×9 IMPLANT
TOWEL OR 17X24 6PK STRL BLUE (TOWEL DISPOSABLE) ×3 IMPLANT
TOWEL OR 17X26 10 PK STRL BLUE (TOWEL DISPOSABLE) ×3 IMPLANT
TUBE CONNECTING 12'X1/4 (SUCTIONS) ×2
TUBE CONNECTING 12X1/4 (SUCTIONS) ×4 IMPLANT
WATER STERILE IRR 1000ML POUR (IV SOLUTION) ×3 IMPLANT
YANKAUER SUCT BULB TIP NO VENT (SUCTIONS) ×6 IMPLANT

## 2017-04-08 NOTE — Progress Notes (Signed)
Pharmacy - Home Medications  Spoke with patient who acknowledges taking both chlorthalidone and losartan-HCTZ PTA - both are prescribed by the same provider although filled at 2 different pharmacies. We discussed the chlorthalidone and HCTZ as being duplicate therapy. I advised her to discuss this with her provider upon discharge.   Renold Genta, PharmD, [place] Pharmacist Main pharmacy [phone] 04/08/2017 3:35 PM

## 2017-04-08 NOTE — Transfer of Care (Signed)
Immediate Anesthesia Transfer of Care Note  Patient: Priscilla Higgins  Procedure(s) Performed: Procedure(s): Right Tibiocalcaneal Fusion with Screws to Stabilize Talar Neck Fracture (Right)  Patient Location: PACU  Anesthesia Type:General  Level of Consciousness: oriented, sedated, drowsy, patient cooperative and responds to stimulation  Airway & Oxygen Therapy: Patient Spontanous Breathing and Patient connected to nasal cannula oxygen  Post-op Assessment: Report given to RN, Post -op Vital signs reviewed and stable and Patient moving all extremities X 4  Post vital signs: Reviewed and stable  Last Vitals:  Vitals:   04/08/17 0841  BP: (!) 161/79  Pulse: 64  Resp: 20  Temp: 36.6 C    Last Pain:  Vitals:   04/08/17 0855  TempSrc:   PainSc: 3       Patients Stated Pain Goal: 3 (85/88/50 2774)  Complications: No apparent anesthesia complications

## 2017-04-08 NOTE — Anesthesia Procedure Notes (Addendum)
Procedure Name: Intubation Date/Time: 04/08/2017 10:50 AM Performed by: Valda Favia Pre-anesthesia Checklist: Patient identified, Emergency Drugs available, Suction available, Patient being monitored and Timeout performed Patient Re-evaluated:Patient Re-evaluated prior to induction Oxygen Delivery Method: Circle system utilized Preoxygenation: Pre-oxygenation with 100% oxygen Induction Type: IV induction Ventilation: Mask ventilation without difficulty and Oral airway inserted - appropriate to patient size Laryngoscope Size: Mac and 4 Grade View: Grade II Tube type: Oral Tube size: 7.5 mm Number of attempts: 1 Airway Equipment and Method: Stylet Placement Confirmation: ETT inserted through vocal cords under direct vision,  positive ETCO2 and breath sounds checked- equal and bilateral Secured at: 21 cm Tube secured with: Tape Dental Injury: Teeth and Oropharynx as per pre-operative assessment

## 2017-04-08 NOTE — Addendum Note (Signed)
Addendum  created 04/08/17 1503 by Valda Favia, CRNA   Anesthesia Event edited

## 2017-04-08 NOTE — Anesthesia Postprocedure Evaluation (Signed)
Anesthesia Post Note  Patient: Priscilla Higgins  Procedure(s) Performed: Procedure(s) (LRB): Right Tibiocalcaneal Fusion with Screws to Stabilize Talar Neck Fracture (Right)     Patient location during evaluation: PACU Anesthesia Type: General Level of consciousness: awake and alert and patient cooperative Pain management: pain level controlled Vital Signs Assessment: post-procedure vital signs reviewed and stable Respiratory status: spontaneous breathing and respiratory function stable Cardiovascular status: stable Anesthetic complications: no    Last Vitals:  Vitals:   04/08/17 1357 04/08/17 1400  BP: 127/65   Pulse: 70 65  Resp: 15 11  Temp: (!) 36.3 C     Last Pain:  Vitals:   04/08/17 1357  TempSrc:   PainSc: [place]

## 2017-04-08 NOTE — Anesthesia Procedure Notes (Signed)
Anesthesia Regional Block: Adductor canal block   Pre-Anesthetic Checklist: ,, timeout performed, Correct Patient, Correct Site, Correct Laterality, Correct Procedure, Correct Position, site marked, Risks and benefits discussed,  Surgical consent,  Pre-op evaluation,  At surgeon's request and post-op pain management  Laterality: Right  Prep: chloraprep       Needles:  Injection technique: Single-shot  Needle Type: Echogenic Needle     Needle Length: 9cm  Needle Gauge: 21     Additional Needles:   Procedures: ultrasound guided,,,,,,,,  Narrative:  Start time: 04/08/2017 10:04 AM End time: 04/08/2017 10:10 AM Injection made incrementally with aspirations every 5 mL.  Performed by: Personally  Anesthesiologist: Brieonna Crutcher  Additional Notes: Pt tolerated the procedure well.

## 2017-04-08 NOTE — Op Note (Signed)
04/08/2017  11:45 AM  PATIENT:  Priscilla Higgins    PRE-OPERATIVE DIAGNOSIS:  Avascular Necrosis Right Talus  POST-OPERATIVE DIAGNOSIS:  Same  PROCEDURE:  Right Tibiocalcaneal Fusion with Screws to Stabilize Talar Neck Fracture  SURGEON:  Newt Minion, MD  PHYSICIAN ASSISTANT:None ANESTHESIA:   General  PREOPERATIVE INDICATIONS:  Priscilla Higgins is a  56 y.o. female with a diagnosis of Avascular Necrosis Right Talus who failed conservative measures and elected for surgical management.    The risks benefits and alternatives were discussed with the patient preoperatively including but not limited to the risks of infection, bleeding, nerve injury, cardiopulmonary complications, the need for revision surgery, among others, and the patient was willing to proceed.  OPERATIVE IMPLANTS: Synthes 10x150 tibial calcaneal nail  OPERATIVE FINDINGS: Avascular necrosis of the talus  OPERATIVE PROCEDURE: Patient brought the operating room and underwent a general anesthetic. After adequate levels anesthesia obtained patient's was placed in the left lateral decubitus position with the right side up and the right lower extremity was prepped using DuraPrep draped into a sterile field a timeout was called. A lateral incision was made over the fibula sharp dissection was carried down the fibula and the distal aspect of the fibula was resected. Holding the ankle reduced at 90 to the long axis of the tibia. Tibia and talar were resected of the articular cartilage back to bleeding viable cancellus bone. The joint was reduced and a separate incision was made plantarly and a guidewire was inserted from the calcaneus up to the tibia. C-arm floss be verified alignment. This was overdrilled and then a cannulated reamer was used to ream to 11 mm. The 10 x 150 mm nail was inserted this was locked distally into the calcaneus 2 the alignment was checked the fracture site was compressed and the proximal locking screw 28 mm in  length was inserted. C-arm floss be verified alignment in both AP and lateral planes. The wound was irrigated with normal saline throughout the case. The Esmarch was released hemostasis was obtained. The incisions were closed using 2-0 nylon. A sterile compressive dressing was applied patient was extubated taken the PACU in stable condition  Plan for overnight observation prescription on the chart for Percocet discharge Saturday.

## 2017-04-08 NOTE — H&P (Signed)
Priscilla Higgins is an 56 y.o. female.   Chief Complaint: Traumatic arthritis right ankle and subtalar joint. HPI: Patient is a 56 year old woman with avascular process of the talus traumatic collapse of the tibial talar and subtalar joint. She is felt conservative treatment and presents at this time for fusion.  Past Medical History:  Diagnosis Date  . Anxiety   . Arthritis   . Avascular necrosis ([facility])    right talus  . Back pain   . Cancer ([place])   . Depressed   . GERD (gastroesophageal reflux disease)   . Heart murmur   . Hypertension   . PONV (postoperative nausea and vomiting)   . Tibia fracture 03/17/2016    Past Surgical History:  Procedure Laterality Date  . ABDOMINAL HYSTERECTOMY    . BACK SURGERY     x4   . CERVICAL FUSION    . CHOLECYSTECTOMY    . CLOSED REDUCTION ANKLE FRACTURE Right 03/17/2016  . COLONOSCOPY    . DILATION AND CURETTAGE OF UTERUS    . SPINAL CORD STIMULATOR IMPLANT    . TUBAL LIGATION    . WISDOM TOOTH EXTRACTION      Family History  Problem Relation Age of Onset  . Heart disease Mother   . COPD Mother   . Heart disease Father    Social History:  reports that she has never smoked. She has never used smokeless tobacco. She reports that she does not drink alcohol or use drugs.  Allergies:  Allergies  Allergen Reactions  . Sulfa Antibiotics Hives  . Latex Rash  . Tape Rash and Other (See Comments)    Blisters  PATIENT TOLERATES PAPER TAPE     No prescriptions prior to admission.    No results found for this or any previous visit (from the past 48 hour(s)). No results found.  Review of Systems  All other systems reviewed and are negative.   There were no vitals taken for this visit. Physical Exam  Examination patient is alert oriented no adenopathy well-dressed normal affect and was fractured she does have an antalgic gait she has good pulses. She has pain with weightbearing radiographs shows collapse of the tibial talar subtalar  joint as well as fracture through the talar neck. Assessment/Plan Assessment: Right ankle and subtalar arthritis status post trauma.  Plan: We'll plan for tibial calcaneal fusion. Risks and benefits were discussed including infection neurovascular injury persistent pain and need for additional surgery. Patient states she understands wish to proceed at this time  Newt Minion, MD 04/08/2017, 7:04 AM

## 2017-04-08 NOTE — Anesthesia Preprocedure Evaluation (Signed)
Anesthesia Evaluation  Patient identified by MRN, date of birth, ID band Patient awake    Reviewed: Allergy & Precautions, H&P , NPO status , Patient's Chart, lab work & pertinent test results  History of Anesthesia Complications (+) PONV and history of anesthetic complications  Airway Mallampati: II   Neck ROM: full    Dental   Pulmonary    breath sounds clear to auscultation       Cardiovascular hypertension,  Rhythm:regular Rate:Normal     Neuro/Psych PSYCHIATRIC DISORDERS Anxiety Depression Spinal cord stimulator    GI/Hepatic GERD  ,  Endo/Other    Renal/GU      Musculoskeletal  (+) Arthritis , Tibia fx   Abdominal   Peds  Hematology   Anesthesia Other Findings   Reproductive/Obstetrics                             Anesthesia Physical Anesthesia Plan  ASA: II  Anesthesia Plan: General   Post-op Pain Management:  Regional for Post-op pain   Induction:   PONV Risk Score and Plan: 4 or greater and Ondansetron, Dexamethasone, Propofol, Midazolam, Scopolamine patch - Pre-op and Treatment may vary due to age or medical condition  Airway Management Planned: LMA  Additional Equipment:   Intra-op Plan:   Post-operative Plan:   Informed Consent: I have reviewed the patients History and Physical, chart, labs and discussed the procedure including the risks, benefits and alternatives for the proposed anesthesia with the patient or authorized representative who has indicated his/her understanding and acceptance.     Plan Discussed with: CRNA, Anesthesiologist and Surgeon  Anesthesia Plan Comments:         Anesthesia Quick Evaluation

## 2017-04-08 NOTE — Anesthesia Procedure Notes (Signed)
Anesthesia Regional Block: Popliteal block   Pre-Anesthetic Checklist: ,, timeout performed, Correct Patient, Correct Site, Correct Laterality, Correct Procedure, Correct Position, site marked, Risks and benefits discussed,  Surgical consent,  Pre-op evaluation,  At surgeon's request and post-op pain management  Laterality: Right  Prep: chloraprep       Needles:  Injection technique: Single-shot  Needle Type: Echogenic Stimulator Needle          Additional Needles:   Procedures: ultrasound guided, nerve stimulator,,,,,,   Nerve Stimulator or Paresthesia:  Response: plantar flexion of foot, 0.45 mA,   Additional Responses:   Narrative:  Start time: 04/08/2017 9:53 AM End time: 04/08/2017 10:04 AM Injection made incrementally with aspirations every 5 mL.  Performed by: Personally  Anesthesiologist: Ziya Coonrod  Additional Notes: Functioning IV was confirmed and monitors were applied.  A 45mm 21ga Arrow echogenic stimulator needle was used. Sterile prep and drape,hand hygiene and sterile gloves were used.  Negative aspiration and negative test dose prior to incremental administration of local anesthetic. The patient tolerated the procedure well.  Ultrasound guidance: relevent anatomy identified, needle position confirmed, local anesthetic spread visualized around nerve(s), vascular puncture avoided.  Image printed for medical record.

## 2017-04-08 NOTE — OR Nursing (Signed)
Esmark tourniquet up at 1106, down at 1133.

## 2017-04-09 DIAGNOSIS — M879 Osteonecrosis, unspecified: Secondary | ICD-10-CM | POA: Diagnosis not present

## 2017-04-09 NOTE — Care Management Note (Signed)
Case Management Note  Patient Details  Name: Evola Hollis MRN: [id number] Date of Birth: 1961-06-10  Subjective/Objective:  Spoke to pt about process of obtaining Knee Scooter for private pay of $80.00/2 months lease at [employer] [facility] store on Monday. CM ordered RW for her use until this can be obtained. [facility] will provide. No further CM needs as she is NWB x 3 weeks s/P ankle fusion.                  Action/Plan:CM will sign off for now but will be available should additional discharge needs arise or disposition change.   Expected Discharge Date:  04/09/17               Expected Discharge Plan:     In-House Referral:     Discharge planning Services  CM Consult  Post Acute Care Choice:  Durable Medical Equipment Choice offered to:  Patient  DME Arranged:  [place] Rile rolling DME Agency:  [place]:  NA [place] Agency:     Status of Service:  Completed, signed off  If discussed at [place] of Stay Meetings, dates discussed:    Additional Comments:  Delrae Sawyers, RN 04/09/2017, 9:55 AM

## 2017-04-09 NOTE — Progress Notes (Signed)
PT brief Evaluation Note on 04/09/17  Pt is able to perform gait x 500' with knee scooter. Pt requires Min guard for safety with transfers and gait. Pt would like to have a KNEE SCOOTER at discharge for improved mobility. Is able to negotiate home with RW as needed at this time. No PT follow-up is required. Full evaluation to follow.   Scheryl Marten PT, DPT  [phone]

## 2017-04-09 NOTE — Progress Notes (Signed)
Orthopedic Tech Progress Note Patient Details:  Priscilla Higgins Sep 15, 1961 [id number]  Ortho Devices Type of Ortho Device: CAM walker Ortho Device/Splint Interventions: Application   [place] Pink 04/09/2017, 8:55 AM

## 2017-04-09 NOTE — Discharge Summary (Signed)
Patient ID: Priscilla Higgins MRN: [id number] DOB/AGE: [birth year] 56 y.o.  Admit date: 04/08/2017 Discharge date: 04/09/2017  Admission Diagnoses:  Active Problems:   Avascular necrosis of right talus [facility])   Status post ankle fusion   Discharge Diagnoses:  Same  Past Medical History:  Diagnosis Date  . Anxiety   . Arthritis    "all over" (04/08/2017)  . Avascular necrosis (HCC)    right talus  . Chronic back pain   . Depressed   . Follicular lymphoma ([place])   . GERD (gastroesophageal reflux disease)   . Heart murmur   . History of blood transfusion    "related to back OR"  . Hypertension   . PONV (postoperative nausea and vomiting)   . Tibia fracture 03/17/2016    Surgeries: Procedure(s): Right Tibiocalcaneal Fusion with Screws to Stabilize Talar Neck Fracture on 04/08/2017   Consultants:   Discharged Condition: Improved  Hospital Course: Priscilla Higgins is an 56 y.o. female who was admitted 04/08/2017 for operative treatment of<principal problem not specified>. Patient has severe unremitting pain that affects sleep, daily activities, and work/hobbies. After pre-op clearance the patient was taken to the operating room on 04/08/2017 and underwent  Procedure(s): Right Tibiocalcaneal Fusion with Screws to Stabilize Talar Neck Fracture.    Patient was given perioperative antibiotics: Anti-infectives    Start     Dose/Rate Route Frequency Ordered Stop   04/08/17 1630  ceFAZolin (ANCEF) IVPB 1 g/50 mL premix     1 g 100 mL/hr over 30 Minutes Intravenous Every 6 hours 04/08/17 1506 04/09/17 0500   04/08/17 0900  ceFAZolin (ANCEF) IVPB 2g/100 mL premix     2 g 200 mL/hr over 30 Minutes Intravenous To Short Stay 04/08/17 0830 04/08/17 1053       Patient was given sequential compression devices, early ambulation, and chemoprophylaxis to prevent DVT.  Patient benefited maximally from hospital stay and there were no complications.    Recent vital signs: Patient Vitals for the past 24  hrs:  BP Temp Temp src Pulse Resp SpO2  04/09/17 0428 108/67 98.3 F (36.8 C) Oral 68 16 94 %  04/09/17 0000 108/64 97.8 F (36.6 C) Oral 75 16 96 %  04/08/17 2007 (!) 116/57 98.2 F (36.8 C) Oral 79 16 97 %  04/08/17 1516 113/67 97.9 F (36.6 C) Oral 66 - 92 %  04/08/17 1400 - - - 65 11 (!) 89 %  04/08/17 1357 127/65 (!) 97.3 F (36.3 C) - 70 15 95 %  04/08/17 1348 - - - 65 13 96 %  04/08/17 1336 - - - 67 11 98 %  04/08/17 1324 - - - 64 14 97 %  04/08/17 1312 - - - 65 13 97 %  04/08/17 1305 - (!) 97.5 F (36.4 C) - - - -  04/08/17 1300 - - - 66 12 98 %  04/08/17 1250 134/77 - - 66 12 97 %  04/08/17 1248 - - - 68 13 97 %  04/08/17 1236 - - - 68 14 98 %  04/08/17 1235 125/73 - - 71 15 97 %  04/08/17 1224 - - - 70 13 97 %  04/08/17 1220 (!) 150/84 - - 75 14 98 %  04/08/17 1212 - - - 86 19 99 %  04/08/17 1205 114/86 (!) 97.4 F (36.3 C) - 81 16 99 %     Recent laboratory studies:  Recent Labs  04/08/17 0836  WBC 5.9  HGB 12.3  HCT 37.2  PLT 196  NA 135  K 3.3*  CL 102  CO2 25  BUN 21*  CREATININE 0.80  GLUCOSE 101*  CALCIUM 9.2     Discharge Medications:   Allergies as of 04/09/2017      Reactions   Sulfa Antibiotics Hives   Latex Rash   Tape Rash, Other (See Comments)   Blisters  PATIENT TOLERATES PAPER TAPE      Medication List    STOP taking these medications   diclofenac sodium 1 % Gel Commonly known as:  VOLTAREN   traMADol 50 MG tablet Commonly known as:  ULTRAM     TAKE these medications   acetaminophen 650 MG CR tablet Commonly known as:  TYLENOL Take 1,300 mg by mouth every 8 (eight) hours as needed for pain.   ALPRAZolam 1 MG tablet Commonly known as:  XANAX Take 1 mg by mouth See admin instructions. TAKE 1 TABLET SCHEDULED EVERY NIGHT & MAY TAKE 1 TABLET DAILY IF NEEDED FOR ANXIETY   amitriptyline 25 MG tablet Commonly known as:  ELAVIL Take 25 mg by mouth at bedtime.   BIOFREEZE EX Apply 1 application topically 4 (four) times  daily as needed (for pain.).   chlorthalidone 25 MG tablet Commonly known as:  HYGROTON Take 25 mg by mouth daily.   estradiol 0.05 mg/24hr patch Commonly known as:  CLIMARA - Dosed in mg/24 hr APPLY 1 PATCH TO SKIN TWICE A WEEK ON TUESDAY & FRIDAY   FLUoxetine 20 MG capsule Commonly known as:  PROZAC Take 40 mg by mouth daily. Reported on 03/17/2016   fluticasone 50 MCG/ACT nasal spray Commonly known as:  FLONASE Place 1 spray into both nostrils daily as needed for allergies or rhinitis.   gabapentin 300 MG capsule Commonly known as:  NEURONTIN Take 300 mg by mouth 3 (three) times daily.   ibuprofen 200 MG tablet Commonly known as:  ADVIL,MOTRIN Take 600 mg by mouth every 8 (eight) hours as needed (for pain.).   losartan-hydrochlorothiazide 100-25 MG tablet Commonly known as:  HYZAAR Take 1 tablet by mouth daily.   magnesium gluconate 500 MG tablet Commonly known as:  MAGONATE Take 500 mg by mouth daily.   methocarbamol 500 MG tablet Commonly known as:  ROBAXIN TAKE 1 TABLET BY MOUTH EVERY 6 TO 8 HOURS AS NEEDED FOR PAIN AND  MUSCLE  SPASM   omeprazole 40 MG capsule Commonly known as:  PRILOSEC Take 40 mg by mouth daily.   oxyCODONE-acetaminophen 5-325 MG tablet Commonly known as:  ROXICET Take 1 tablet by mouth every 4 (four) hours as needed for severe pain.   STOOL SOFTENER & LAXATIVE 8.6-50 MG tablet Generic drug:  senna-docusate Take 3 tablets by mouth every other day.       Diagnostic Studies: Xr Ankle Complete Right  Result Date: 03/19/2017 Stable ankle   Disposition: 06-Home-Health Care Svc  Discharge Instructions    Apply cam walker    Complete by:  As directed    Laterality:  Right   Call MD / Call 911    Complete by:  As directed    If you experience chest pain or shortness of breath, CALL 911 and be transported to the hospital emergency room.  If you develope a fever above 101 F, pus (white drainage) or increased drainage or redness at the  wound, or calf pain, call your surgeon's office.   Call MD / Call 911    Complete by:  As directed    If  you experience chest pain or shortness of breath, CALL 911 and be transported to the hospital emergency room.  If you develope a fever above 101 F, pus (white drainage) or increased drainage or redness at the wound, or calf pain, call your surgeon's office.   Constipation Prevention    Complete by:  As directed    Drink plenty of fluids.  Prune juice may be helpful.  You may use a stool softener, such as Colace (over the counter) 100 mg twice a day.  Use MiraLax (over the counter) for constipation as needed.   Constipation Prevention    Complete by:  As directed    Drink plenty of fluids.  Prune juice may be helpful.  You may use a stool softener, such as Colace (over the counter) 100 mg twice a day.  Use MiraLax (over the counter) for constipation as needed.   Diet - low sodium heart healthy    Complete by:  As directed    Diet - low sodium heart healthy    Complete by:  As directed    Elevate operative extremity    Complete by:  As directed    Increase activity slowly as tolerated    Complete by:  As directed    Increase activity slowly as tolerated    Complete by:  As directed    Non weight bearing    Complete by:  As directed    Laterality:  right   Extremity:  Lower      Follow-up Information    Suzan Slick, NP Follow up in 1 week(s).   Specialty:  Orthopedic Surgery Contact information: [place] [address] [phone]            Signed: Erskine Emery 04/09/2017, 9:27 AM

## 2017-04-09 NOTE — Progress Notes (Signed)
Subjective: 1 Day Post-Op Procedure(s) (LRB): Right Tibiocalcaneal Fusion with Screws to Stabilize Talar Neck Fracture (Right) Patient reports pain as none block still working. Wants to go home this AM. Requesting a knee scooter.    Objective: Vital signs in last 24 hours: Temp:  [97.3 F (36.3 C)-98.3 F (36.8 C)] 98.3 F (36.8 C) (07/14 0428) Pulse Rate:  [64-86] 68 (07/14 0428) Resp:  [11-19] 16 (07/14 0428) BP: (108-150)/(57-86) 108/67 (07/14 0428) SpO2:  [89 %-99 %] 94 % (07/14 0428)  Intake/Output from previous day: 07/13 0701 - 07/14 0700 In: 1550 [P.O.:600; I.V.:900; IV Piggyback:50] Out: 50 [Blood:50] Intake/Output this shift: No intake/output data recorded.   Recent Labs  04/08/17 0836  HGB 12.3    Recent Labs  04/08/17 0836  WBC 5.9  RBC 4.25  HCT 37.2  PLT 196    Recent Labs  04/08/17 0836  NA 135  K 3.3*  CL 102  CO2 25  BUN 21*  CREATININE 0.80  GLUCOSE 101*  CALCIUM 9.2   No results for input(s): LABPT, INR in the last 72 hours.  Incision: dressing C/D/I Compartment soft  Right knee some sensation to touch distally numbness persist due to block. Unable to wiggle toes. Toes are well perfused.   Assessment/Plan: 1 Day Post-Op Procedure(s) (LRB): Right Tibiocalcaneal Fusion with Screws to Stabilize Talar Neck Fracture (Right) Discharge to home. Rx given for knee scooter patient non weight bearing right lower extremity.   GILBERT CLARK 04/09/2017, 9:19 AM

## 2017-04-09 NOTE — Evaluation (Signed)
Physical Therapy Evaluation Patient Details Name: Priscilla Higgins MRN: [id number] DOB: Aug 31, 1961 Today's Date: 04/09/2017   History of Present Illness  Pt is a 56 yo female admitted for right ankle fusion following avascular necrosis R talus. PMH significant for anxiety, CA, GERD, depression, heart murmur, HTn, tibia fx 6/17.   Clinical Impression  Pt presents with the above diagnosis and below deficits for therapy evaluation. Prior to admission, pt was living with her husband in a single level home with the availability of ramp to get into home. Pt requires supervision to min guard for mobility this session and performs majority of mobility with knee scooter. Pt has been mobilizing around home with a RW and WC. Pt will benefit from continued acute PT follow-up in order to address the below deficits prior to discharge home.     Follow Up Recommendations No PT follow up    Equipment Recommendations  Other (comment) (knee scooter)    Recommendations for Other Services       Precautions / Restrictions Precautions Precautions: Fall Required Braces or Orthoses: Other Brace/Splint Other Brace/Splint: cam boot RLE Restrictions Weight Bearing Restrictions: Yes RLE Weight Bearing: Non weight bearing      Mobility  Bed Mobility Overal bed mobility: Independent                Transfers Overall transfer level: Needs assistance Equipment used: Rolling walker (2 wheeled);None Transfers: Sit to/from [employer] to Stand: Supervision Stand pivot transfers: Supervision       General transfer comment: Supervision for safety  Ambulation/Gait Ambulation/Gait assistance: Supervision Ambulation Distance (Feet): 500 Feet Assistive device:  (knee scooter) Gait Pattern/deviations: Step-to pattern Gait velocity: decreased Gait velocity interpretation: Below normal speed for age/gender General Gait Details: good sequencing and mobility with knee scooter. pt with good  balance and control  Stairs            Wheelchair Mobility    Modified Rankin (Stroke Patients Only)       Balance Overall balance assessment: Needs assistance Sitting-balance support: No upper extremity supported;Feet supported Sitting balance-Leahy Scale: Normal     Standing balance support: No upper extremity supported;Bilateral upper extremity supported Standing balance-Leahy Scale: Fair                               Pertinent Vitals/Pain Pain Assessment: 0-10 Pain Score: 0-No pain Pain Intervention(s): Monitored during session    Home Living Family/patient expects to be discharged to:: Private residence Living Arrangements: Spouse/significant other Available Help at Discharge: Family;Available 24 hours/day Type of Home: House Home Access: Stairs to enter Entrance Stairs-Rails: Right;Left;Can reach both Entrance Stairs-Number of Steps: 3 Home Layout: One level Home Equipment: Bedside commode;Walker - 2 wheels;Wheelchair - manual;Cane - single point      Prior Function Level of Independence: Independent with assistive device(s)         Comments: was using Dovray for mobility, but was completely independent otherwise.     Hand Dominance        Extremity/Trunk Assessment   Upper Extremity Assessment Upper Extremity Assessment: Overall WFL for tasks assessed    Lower Extremity Assessment Lower Extremity Assessment: RLE deficits/detail RLE Deficits / Details: post op weakenss with no mobility in toes from nerve block. At least 3/5 knee and hip RLE: Unable to fully assess due to immobilization RLE Sensation: decreased light touch    Cervical / Trunk Assessment Cervical / Trunk Assessment: Normal  Communication   Communication: No difficulties  Cognition Arousal/Alertness: Awake/alert Behavior During Therapy: WFL for tasks assessed/performed Overall Cognitive Status: Within Functional Limits for tasks assessed                                         General Comments      Exercises     Assessment/Plan    PT Assessment Patient needs continued PT services  PT Problem List Decreased strength;Decreased activity tolerance;Decreased balance;Decreased mobility;Decreased knowledge of use of DME;Pain       PT Treatment Interventions DME instruction;Gait training;Stair training;Functional mobility training;Therapeutic activities;Therapeutic exercise;Balance training    PT Goals (Current goals can be found in the Care Plan section)  Acute Rehab PT Goals Patient Stated Goal: to get home  PT Goal Formulation: With patient/family Time For Goal Achievement: 04/16/17 Potential to Achieve Goals: Good    Frequency Min 5X/week   Barriers to discharge        Co-evaluation               AM-PAC PT "6 Clicks" Daily Activity  Outcome Measure Difficulty turning over in bed (including adjusting bedclothes, sheets and blankets)?: None Difficulty moving from lying on back to sitting on the side of the bed? : None Difficulty sitting down on and standing up from a chair with arms (e.g., wheelchair, bedside commode, etc,.)?: A Little Help needed moving to and from a bed to chair (including a wheelchair)?: A Little Help needed walking in hospital room?: A Little Help needed climbing 3-5 steps with a railing? : A Little 6 Click Score: 20    End of Session Equipment Utilized During Treatment: Gait belt Activity Tolerance: Patient tolerated treatment well Patient left: in chair;with call bell/phone within reach;with family/visitor present Nurse Communication: Mobility status PT Visit Diagnosis: Unsteadiness on feet (R26.81);Muscle weakness (generalized) (M62.81)    Time: 4034-7425 PT Time Calculation (min) (ACUTE ONLY): 31 min   Charges:   PT Evaluation $PT Eval Moderate Complexity: 1 Procedure PT Treatments $Gait Training: 8-22 mins   PT G Codes:   PT G-Codes **NOT FOR INPATIENT CLASS** Functional  Assessment Tool Used: AM-PAC 6 Clicks Basic Mobility;Clinical judgement Functional Limitation: Mobility: Walking and moving around Mobility: Walking and Moving Around Current Status (Z5638): At least 20 percent but less than 40 percent impaired, limited or restricted Mobility: Walking and Moving Around Goal Status [phone]): At least 1 percent but less than 20 percent impaired, limited or restricted    Scheryl Marten PT, DPT  [phone]   Priscilla Higgins 04/09/2017, 1:45 PM

## 2017-04-09 NOTE — Progress Notes (Signed)
Pt ready for d/c from unit to home. Husband here to transport. All personal items with pt. Discharge and rx reviewed with pt   DME delivered to room, rolling walker. Rx written for scooter.

## 2017-04-11 ENCOUNTER — Encounter (HOSPITAL_COMMUNITY): Payer: Self-pay | Admitting: Orthopedic Surgery

## 2017-04-15 ENCOUNTER — Telehealth (INDEPENDENT_AMBULATORY_CARE_PROVIDER_SITE_OTHER): Payer: Self-pay | Admitting: Family

## 2017-04-15 ENCOUNTER — Ambulatory Visit (INDEPENDENT_AMBULATORY_CARE_PROVIDER_SITE_OTHER): Payer: Medicare HMO | Admitting: Family

## 2017-04-15 ENCOUNTER — Ambulatory Visit (INDEPENDENT_AMBULATORY_CARE_PROVIDER_SITE_OTHER): Payer: Medicare HMO

## 2017-04-15 VITALS — Ht 61.0 in | Wt 184.0 lb

## 2017-04-15 DIAGNOSIS — Z981 Arthrodesis status: Secondary | ICD-10-CM

## 2017-04-15 DIAGNOSIS — M87071 Idiopathic aseptic necrosis of right ankle: Secondary | ICD-10-CM

## 2017-04-15 MED ORDER — OXYCODONE-ACETAMINOPHEN 5-325 MG PO TABS: 1.0000 | ORAL_TABLET | ORAL | 0 refills | Status: DC | PRN

## 2017-04-15 MED ORDER — GABAPENTIN 300 MG PO CAPS: 300.0000 mg | ORAL_CAPSULE | Freq: Three times a day (TID) | ORAL | 1 refills | Status: AC

## 2017-04-15 MED ORDER — METHOCARBAMOL 500 MG PO TABS: 500.0000 mg | ORAL_TABLET | Freq: Three times a day (TID) | ORAL | 0 refills | Status: AC | PRN

## 2017-04-15 NOTE — Telephone Encounter (Signed)
I called and spoke with patient advised we will order xrays for her to have in the office at her appointment.

## 2017-04-15 NOTE — Progress Notes (Signed)
Post-Op Visit Note   Patient: Priscilla Higgins           Date of Birth: [birth year]           MRN: [id number] Visit Date: 04/15/2017 PCP: Emelda Fear, DO  Chief Complaint:  Chief Complaint  Patient presents with  . Right Ankle - Routine Post Op    HPI:  The patient is a 56 year old woman who is 1 week status post right tibiocalcaneal fusion. She was lifting some tears in her kitchen and had a fall yesterday complains of increased ankle pain following the fall she is very concerned today. Radiographs were obtained.    Ortho Exam Incisions are well approximated there is no gaping scant bloody drainage there is no erythema no purulence moderate swelling no sign of infection.  Visit Diagnoses:  1. Status post ankle fusion   2. Avascular necrosis of right talus [facility])     Plan: Begin daily Dial soap cleansing. Apply dry dressings. Advised to wear her Cam Walker at all times continue nonweightbearing on the kneeling scooter.  Follow-Up Instructions: Return in about 1 week (around 04/22/2017) for suture removal.   Imaging: Xr Ankle Complete Right  Result Date: 04/15/2017 Radiographs of the right ankle show stable alignment of tibiocalcaneal fusion with talar screws. No complicating features.   Orders:  Orders Placed This Encounter  Procedures  . XR Ankle Complete Right   Meds ordered this encounter  Medications  . oxyCODONE-acetaminophen (ROXICET) 5-325 MG tablet    Sig: Take 1 tablet by mouth every 4 (four) hours as needed for severe pain.    Dispense:  42 tablet    Refill:  0  . gabapentin (NEURONTIN) 300 MG capsule    Sig: Take 1 capsule (300 mg total) by mouth 3 (three) times daily.    Dispense:  90 capsule    Refill:  1  . methocarbamol (ROBAXIN) 500 MG tablet    Sig: Take 1 tablet (500 mg total) by mouth every 8 (eight) hours as needed for muscle spasms.    Dispense:  60 tablet    Refill:  0     PMFS History: Patient Active Problem List   Diagnosis Date Noted    . Status post ankle fusion 04/08/2017  . Avascular necrosis of right talus ([place])   . 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 Dislocation of right subtalar joint 03/17/2016  . Follicular lymphoma ([place]) 03/03/2016   Past Medical History:  Diagnosis Date  . Anxiety   . Arthritis    "all over" (04/08/2017)  . Avascular necrosis (HCC)    right talus  . Chronic back pain   . Depressed   . Follicular lymphoma ([place])   . GERD (gastroesophageal reflux disease)   . Heart murmur   . History of blood transfusion    "related to back OR"  . Hypertension   . PONV (postoperative nausea and vomiting)   . Tibia fracture 03/17/2016    Family History  Problem Relation Age of Onset  . Heart disease Mother   . COPD Mother   . Heart disease Father     Past Surgical History:  Procedure Laterality Date  . ABDOMINAL HYSTERECTOMY    . ANKLE FUSION Right 04/08/2017   Tibiocalcaneal Fusion with Screws to Stabilize Talar Neck Fracture/notes 04/08/2017  . ANKLE FUSION Right 04/08/2017   Procedure: Right Tibiocalcaneal Fusion with Screws to Stabilize Talar Neck Fracture;  Surgeon:  Newt Minion, MD;  Location: [facility];  Service: Orthopedics;  Laterality: Right;  . ANTERIOR CERVICAL DECOMP/DISCECTOMY FUSION  2000  . BACK SURGERY    . CLOSED REDUCTION ANKLE FRACTURE Right 03/17/2016  . COLONOSCOPY    . DILATION AND CURETTAGE OF UTERUS    . FRACTURE SURGERY    . LAPAROSCOPIC CHOLECYSTECTOMY    . LUMBAR DISC SURGERY  ~ 1988  . POSTERIOR LUMBAR FUSION  ~ 1990   "took bone from my hip"  . POSTERIOR LUMBAR FUSION  ~ 2002   "put cages in"  . SPINAL CORD STIMULATOR IMPLANT  ~ 2004  . TUBAL LIGATION    . WISDOM TOOTH EXTRACTION     Social History   Occupational History  . Not on file.   Social History Main Topics  . Smoking status: Never Smoker  . Smokeless tobacco: Never Used  . Alcohol use No  . Drug use: No  .  Sexual activity: No

## 2017-04-15 NOTE — Telephone Encounter (Signed)
Patient called advised she fell yesterday and hurt her rt ankle. Patient asked if she can be X-rayed when she come for her appointment today. The number to contact patient is [phone]

## 2017-04-21 ENCOUNTER — Encounter

## 2017-04-25 ENCOUNTER — Ambulatory Visit (INDEPENDENT_AMBULATORY_CARE_PROVIDER_SITE_OTHER): Payer: Medicare HMO | Admitting: Orthopedic Surgery

## 2017-04-25 ENCOUNTER — Encounter (INDEPENDENT_AMBULATORY_CARE_PROVIDER_SITE_OTHER): Payer: Self-pay | Admitting: Orthopedic Surgery

## 2017-04-25 DIAGNOSIS — Z981 Arthrodesis status: Secondary | ICD-10-CM

## 2017-04-25 MED ORDER — DOXYCYCLINE HYCLATE 100 MG PO TABS: 100.0000 mg | ORAL_TABLET | Freq: Two times a day (BID) | ORAL | 0 refills | Status: DC

## 2017-04-25 MED ORDER — TRAMADOL HCL 50 MG PO TABS: 50.0000 mg | ORAL_TABLET | Freq: Four times a day (QID) | ORAL | 0 refills | Status: AC | PRN

## 2017-04-25 NOTE — Addendum Note (Signed)
Addended by: Dondra Prader R on: 04/25/2017 03:53 PM   Modules accepted: Orders

## 2017-04-25 NOTE — Progress Notes (Signed)
Post-Op Visit Note   Patient: Priscilla Higgins           Date of Birth: 1961/04/26           MRN: [id number] Visit Date: 04/25/2017 PCP: Emelda Fear, DO  Chief Complaint:  Chief Complaint  Patient presents with  . Right Ankle - Routine Post Op    04/08/17 Right Tibiocalcaneal Fusion 17 days post op    HPI:  Patient is a 56 year old woman seen 2 weeks status post tib cal fusion right ankle. Sutures remain in place. Is nonweight bearing in fracture boot with kneeling scooter.     Ortho Exam Medial heel and plantar heel incisions well healed. No erythema or drainage. The lateral incision is approximated with sutures. Distally has some gaping, less than 1 cm is open 2 mm wide. Serosanguinous drainage. No odor, erythema or maceration.   Visit Diagnoses:  1. Status post ankle fusion     Plan: will harvest plantar and medial sutures. Continue daily wound care. Continue nonweight bearing. Follow up in 2 weeks with 3 views of ankle.   Follow-Up Instructions: Return in about 2 weeks (around 05/09/2017).   Imaging: No results found.  Orders:  No orders of the defined types were placed in this encounter.  No orders of the defined types were placed in this encounter.    PMFS History: Patient Active Problem List   Diagnosis Date Noted  . Status post ankle fusion 04/08/2017  . Avascular necrosis of right talus ([place])   . 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 Dislocation of right subtalar joint 03/17/2016  . Follicular lymphoma ([place]) 03/03/2016   Past Medical History:  Diagnosis Date  . Anxiety   . Arthritis    "all over" (04/08/2017)  . Avascular necrosis (HCC)    right talus  . Chronic back pain   . Depressed   . Follicular lymphoma ([place])   . GERD (gastroesophageal reflux disease)   . Heart murmur   . History of blood transfusion    "related to back OR"  . Hypertension   . PONV  (postoperative nausea and vomiting)   . Tibia fracture 03/17/2016    Family History  Problem Relation Age of Onset  . Heart disease Mother   . COPD Mother   . Heart disease Father     Past Surgical History:  Procedure Laterality Date  . ABDOMINAL HYSTERECTOMY    . ANKLE FUSION Right 04/08/2017   Tibiocalcaneal Fusion with Screws to Stabilize Talar Neck Fracture/notes 04/08/2017  . ANKLE FUSION Right 04/08/2017   Procedure: Right Tibiocalcaneal Fusion with Screws to Stabilize Talar Neck Fracture;  Surgeon: Newt Minion, MD;  Location: [facility];  Service: Orthopedics;  Laterality: Right;  . ANTERIOR CERVICAL DECOMP/DISCECTOMY FUSION  2000  . BACK SURGERY    . CLOSED REDUCTION ANKLE FRACTURE Right 03/17/2016  . COLONOSCOPY    . DILATION AND CURETTAGE OF UTERUS    . FRACTURE SURGERY    . LAPAROSCOPIC CHOLECYSTECTOMY    . LUMBAR DISC SURGERY  ~ 1988  . POSTERIOR LUMBAR FUSION  ~ 1990   "took bone from my hip"  . POSTERIOR LUMBAR FUSION  ~ 2002   "put cages in"  . SPINAL CORD STIMULATOR IMPLANT  ~ 2004  . TUBAL LIGATION    . WISDOM TOOTH EXTRACTION     Social History   Occupational History  . Not on file.  Social History Main Topics  . Smoking status: Never Smoker  . Smokeless tobacco: Never Used  . Alcohol use No  . Drug use: No  . Sexual activity: No

## 2017-05-02 ENCOUNTER — Ambulatory Visit (INDEPENDENT_AMBULATORY_CARE_PROVIDER_SITE_OTHER): Payer: Medicare HMO | Admitting: Orthopedic Surgery

## 2017-05-09 ENCOUNTER — Inpatient Hospital Stay: Admit: 2017-05-09 | Payer: BLUE CROSS/BLUE SHIELD | Attending: Obstetrics & Gynecology | Primary: Family

## 2017-05-09 ENCOUNTER — Ambulatory Visit

## 2017-05-09 ENCOUNTER — Ambulatory Visit (INDEPENDENT_AMBULATORY_CARE_PROVIDER_SITE_OTHER): Payer: Medicare HMO | Admitting: Family

## 2017-05-09 ENCOUNTER — Ambulatory Visit (INDEPENDENT_AMBULATORY_CARE_PROVIDER_SITE_OTHER): Payer: Medicare HMO | Admitting: Orthopedic Surgery

## 2017-05-09 ENCOUNTER — Encounter (INDEPENDENT_AMBULATORY_CARE_PROVIDER_SITE_OTHER): Payer: Self-pay | Admitting: Family

## 2017-05-09 ENCOUNTER — Ambulatory Visit (INDEPENDENT_AMBULATORY_CARE_PROVIDER_SITE_OTHER): Payer: Medicare HMO

## 2017-05-09 VITALS — Ht 61.0 in | Wt 184.0 lb

## 2017-05-09 DIAGNOSIS — M25571 Pain in right ankle and joints of right foot: Secondary | ICD-10-CM

## 2017-05-09 DIAGNOSIS — Z981 Arthrodesis status: Secondary | ICD-10-CM

## 2017-05-09 DIAGNOSIS — Z1231 Encounter for screening mammogram for malignant neoplasm of breast: Secondary | ICD-10-CM

## 2017-05-09 MED ORDER — OXYCODONE-ACETAMINOPHEN 5-325 MG PO TABS: 1.0000 | ORAL_TABLET | Freq: Four times a day (QID) | ORAL | 0 refills | Status: AC | PRN

## 2017-05-09 NOTE — Progress Notes (Signed)
Post-Op Visit Note   Patient: Priscilla Higgins           Date of Birth: [birth year]           MRN: [id number] Visit Date: 05/09/2017 PCP: Emelda Fear, DO  Chief Complaint:  Chief Complaint  Patient presents with  . Right Ankle - Routine Post Op    04/08/17 right tibiocalcaneal fusion     HPI:  The patient is a 56 year old woman who presents today about 4 weeks status post right tibiocalcaneal fusion. She continues to have issues with pain states her pain is worse first thing in the morning and when she is trying to sleep. Her incision has been healing slowly she's continued in the fracture boot has been nonweightbearing with a walker. Recent hasn't taking oxycodone for pain her tramadol has not been helpful.    Ortho Exam On examination the heel incisions are both well-healed there lateral incision distally has a 3 mm length that is open about 2 mm this is filled in with granulation tissue there is no purulence no surrounding erythema no drainage expressed today. Does have tenderness along the distal fibula.  Visit Diagnoses:  1. Status post ankle fusion   2. Pain in right ankle and joints of right foot     Plan: May begin touchdown weightbearing in her fracture boot. Continue with daily wound cleansing and apply neosporin and dressings.   Follow-Up Instructions: No Follow-up on file.   Imaging: No results found.  Orders:  Orders Placed This Encounter  Procedures  . XR Ankle Complete Right   Meds ordered this encounter  Medications  . oxyCODONE-acetaminophen (ROXICET) 5-325 MG tablet    Sig: Take 1 tablet by mouth every 6 (six) hours as needed for severe pain.    Dispense:  42 tablet    Refill:  0     PMFS History: Patient Active Problem List   Diagnosis Date Noted  . Status post ankle fusion 04/08/2017  . 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 Closed traumatic dislocation of right subtalar joint  03/17/2016  . Dislocation of right subtalar joint 03/17/2016  . 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 Anxiety   . Arthritis    "all over" (04/08/2017)  . Avascular necrosis (HCC)    right talus  . Chronic back pain   . Depressed   . Follicular lymphoma ([place])   . GERD (gastroesophageal reflux disease)   . Heart murmur   . History of blood transfusion    "related to back OR"  . Hypertension   . PONV (postoperative nausea and vomiting)   . Tibia fracture 03/17/2016    Family History  Problem Relation Age of Onset  . Heart disease Mother   . COPD Mother   . Heart disease Father     Past Surgical History:  Procedure Laterality Date  . ABDOMINAL HYSTERECTOMY    . ANKLE FUSION Right 04/08/2017   Tibiocalcaneal Fusion with Screws to Stabilize Talar Neck Fracture/notes 04/08/2017  . ANKLE FUSION Right 04/08/2017   Procedure: Right Tibiocalcaneal Fusion with Screws to Stabilize Talar Neck Fracture;  Surgeon: Newt Minion, MD;  Location: [facility];  Service: Orthopedics;  Laterality: Right;  . ANTERIOR CERVICAL DECOMP/DISCECTOMY FUSION  2000  . BACK SURGERY    . CLOSED REDUCTION ANKLE FRACTURE Right 03/17/2016  . COLONOSCOPY    . DILATION AND CURETTAGE OF UTERUS    .  FRACTURE SURGERY    . LAPAROSCOPIC CHOLECYSTECTOMY    . LUMBAR DISC SURGERY  ~ 1988  . POSTERIOR LUMBAR FUSION  ~ 1990   "took bone from my hip"  . POSTERIOR LUMBAR FUSION  ~ 2002   "put cages in"  . SPINAL CORD STIMULATOR IMPLANT  ~ 2004  . TUBAL LIGATION    . WISDOM TOOTH EXTRACTION     Social History   Occupational History  . Not on file.   Social History Main Topics  . Smoking status: Never Smoker  . Smokeless tobacco: Never Used  . Alcohol use No  . Drug use: No  . Sexual activity: No

## 2017-05-17 ENCOUNTER — Other Ambulatory Visit (INDEPENDENT_AMBULATORY_CARE_PROVIDER_SITE_OTHER): Payer: Self-pay | Admitting: Family

## 2017-05-23 ENCOUNTER — Encounter (INDEPENDENT_AMBULATORY_CARE_PROVIDER_SITE_OTHER): Payer: Self-pay | Admitting: Orthopedic Surgery

## 2017-05-23 ENCOUNTER — Ambulatory Visit (INDEPENDENT_AMBULATORY_CARE_PROVIDER_SITE_OTHER): Payer: Medicare HMO | Admitting: Orthopedic Surgery

## 2017-05-23 DIAGNOSIS — Z981 Arthrodesis status: Secondary | ICD-10-CM

## 2017-05-23 NOTE — Progress Notes (Signed)
Office Visit Note   Patient: Priscilla Higgins           Date of Birth: May 20, 1961           MRN: [id number] Visit Date: 05/23/2017              Requested by: Emelda Fear, [place] [address] PCP: Emelda Fear, DO  Chief Complaint  Patient presents with  . Right Ankle - Follow-up      HPI: Patient presents 6 weeks status post right tibial calcaneal fusion she has not been wearing her fracture boot she is in regular shoewear ambulate with a walker.  Assessment & Plan: Visit Diagnoses:  1. Status post ankle fusion     Plan: Recommended compression stockings recommended resuming a fracture boot touchdown weightbearing with the walker.  Two-view radiographs of the right ankle at follow-up.  Follow-Up Instructions: Return in about 3 weeks (around 06/13/2017).   Ortho Exam  Patient is alert, oriented, no adenopathy, well-dressed, normal affect, normal respiratory effort. Examination patient has venous stasis swelling in the right leg. The wound has healed well there is no drainage no cellulitis no signs of infection. Her foot is plantar grade.  Imaging: No results found. No images are attached to the encounter.  Labs: No results found for: HGBA1C, ESRSEDRATE, CRP, LABURIC, REPTSTATUS, GRAMSTAIN, CULT, LABORGA  Orders:  No orders of the defined types were placed in this encounter.  No orders of the defined types were placed in this encounter.    Procedures: No procedures performed  Clinical Data: No additional findings.  ROS:  All other systems negative, except as noted in the HPI. Review of Systems  Objective: Vital Signs: LMP  (LMP Unknown)   Specialty Comments:  No specialty comments available.  PMFS History: Patient Active Problem List   Diagnosis Date Noted  . Status post ankle fusion 04/08/2017  . 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 Closed traumatic dislocation  of right subtalar joint 03/17/2016  . Dislocation of right subtalar joint 03/17/2016  . 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 Anxiety   . Arthritis    "all over" (04/08/2017)  . Avascular necrosis (HCC)    right talus  . Chronic back pain   . Depressed   . Follicular lymphoma ([place])   . GERD (gastroesophageal reflux disease)   . Heart murmur   . History of blood transfusion    "related to back OR"  . Hypertension   . PONV (postoperative nausea and vomiting)   . Tibia fracture 03/17/2016    Family History  Problem Relation Age of Onset  . Heart disease Mother   . COPD Mother   . Heart disease Father     Past Surgical History:  Procedure Laterality Date  . ABDOMINAL HYSTERECTOMY    . ANKLE FUSION Right 04/08/2017   Tibiocalcaneal Fusion with Screws to Stabilize Talar Neck Fracture/notes 04/08/2017  . ANKLE FUSION Right 04/08/2017   Procedure: Right Tibiocalcaneal Fusion with Screws to Stabilize Talar Neck Fracture;  Surgeon: Newt Minion, MD;  Location: [facility];  Service: Orthopedics;  Laterality: Right;  . ANTERIOR CERVICAL DECOMP/DISCECTOMY FUSION  2000  . BACK SURGERY    . CLOSED REDUCTION ANKLE FRACTURE Right 03/17/2016  . COLONOSCOPY    . DILATION AND CURETTAGE OF UTERUS    . FRACTURE SURGERY    . LAPAROSCOPIC CHOLECYSTECTOMY    .  LUMBAR DISC SURGERY  ~ 1988  . POSTERIOR LUMBAR FUSION  ~ 1990   "took bone from my hip"  . POSTERIOR LUMBAR FUSION  ~ 2002   "put cages in"  . SPINAL CORD STIMULATOR IMPLANT  ~ 2004  . TUBAL LIGATION    . WISDOM TOOTH EXTRACTION     Social History   Occupational History  . Not on file.   Social History Main Topics  . Smoking status: Never Smoker  . Smokeless tobacco: Never Used  . Alcohol use No  . Drug use: No  . Sexual activity: No

## 2017-05-24 ENCOUNTER — Telehealth (INDEPENDENT_AMBULATORY_CARE_PROVIDER_SITE_OTHER): Payer: Self-pay | Admitting: Radiology

## 2017-05-24 ENCOUNTER — Other Ambulatory Visit (INDEPENDENT_AMBULATORY_CARE_PROVIDER_SITE_OTHER): Payer: Self-pay | Admitting: Orthopedic Surgery

## 2017-05-24 MED ORDER — OXYCODONE-ACETAMINOPHEN 5-325 MG PO TABS: 1.0000 | ORAL_TABLET | Freq: Three times a day (TID) | ORAL | 0 refills | Status: AC | PRN

## 2017-05-24 MED ORDER — METHOCARBAMOL 500 MG PO TABS: 500.0000 mg | ORAL_TABLET | Freq: Three times a day (TID) | ORAL | 0 refills | Status: AC | PRN

## 2017-05-24 NOTE — Telephone Encounter (Signed)
rx written

## 2017-05-24 NOTE — Telephone Encounter (Signed)
Patient calling this morning wanting to request refill on Robaxin and Oxycodone. She was in the office yesterday and forgot to ask. She uses the [facility] in [place]. She is aware she has to pick up oxycodone prescription in the office. She states she really needs this she is going out of town. She also states [employer] is out of compression stocking 15-74mm, she borrowed 65mm compression stocking from her friend.

## 2017-05-24 NOTE — Telephone Encounter (Signed)
Patient

## 2017-05-25 NOTE — Telephone Encounter (Signed)
I called and spoke with patient to advise rx for robaxin and oxycodone is written. She would like robaxin called into her pharmacy. This was done and called into [facility] in [place]. Did advise her she would need photo id when picking up rx.

## 2017-06-13 ENCOUNTER — Ambulatory Visit (INDEPENDENT_AMBULATORY_CARE_PROVIDER_SITE_OTHER): Payer: Medicare HMO | Admitting: Orthopedic Surgery

## 2017-06-13 ENCOUNTER — Encounter (INDEPENDENT_AMBULATORY_CARE_PROVIDER_SITE_OTHER): Payer: Self-pay | Admitting: Orthopedic Surgery

## 2017-06-13 DIAGNOSIS — Z981 Arthrodesis status: Secondary | ICD-10-CM

## 2017-06-13 MED ORDER — METHOCARBAMOL 500 MG PO TABS: 500.0000 mg | ORAL_TABLET | Freq: Three times a day (TID) | ORAL | 0 refills | Status: AC

## 2017-06-13 NOTE — Progress Notes (Signed)
Office Visit Note   Patient: Priscilla Higgins           Date of Birth: November 29, 1960           MRN: [id number] Visit Date: 06/13/2017              Requested by: Emelda Fear, [place] [address] PCP: Emelda Fear, DO  Chief Complaint  Patient presents with  . Right Ankle - Routine Post Op      HPI: Patient is a 56 year old woman who presents 2 months status post right tibial calcaneal fusion. Patient is currently using Robaxin and oxycodone. She states she's doing much better and has been wearing the compression stocking.  Assessment & Plan: Visit Diagnoses:  1. Status post ankle fusion     Plan: She will advance to weightbearing as tolerated in the fracture. Continue with her medical compression stockings. Follow up in 4 weeks with 3 view radiographs of the right ankle at follow-up.   Prescription provided for Robaxin.  Follow-Up Instructions: Return in about 4 weeks (around 07/11/2017).   Ortho Exam  Patient is alert, oriented, no adenopathy, well-dressed, normal affect, normal respiratory effort. Examination her foot is plantar grade there is decreased swelling the incision is well-healed patient has good midfoot range of motion.  Imaging: No results found. No images are attached to the encounter.  Labs: No results found for: HGBA1C, ESRSEDRATE, CRP, LABURIC, REPTSTATUS, GRAMSTAIN, CULT, LABORGA  Orders:  No orders of the defined types were placed in this encounter.  No orders of the defined types were placed in this encounter.    Procedures: No procedures performed  Clinical Data: No additional findings.  ROS:  All other systems negative, except as noted in the HPI. Review of Systems  Objective: Vital Signs: LMP  (LMP Unknown)   Specialty Comments:  No specialty comments available.  PMFS History: Patient Active Problem List   Diagnosis Date Noted  . Status post ankle fusion 04/08/2017  . Avascular necrosis of right talus  ([place])   . 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 Dislocation of right subtalar joint 03/17/2016  . 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 Anxiety   . Arthritis    "all over" (04/08/2017)  . Avascular necrosis (HCC)    right talus  . Chronic back pain   . Depressed   . Follicular lymphoma ([place])   . GERD (gastroesophageal reflux disease)   . Heart murmur   . History of blood transfusion    "related to back OR"  . Hypertension   . PONV (postoperative nausea and vomiting)   . Tibia fracture 03/17/2016    Family History  Problem Relation Age of Onset  . Heart disease Mother   . COPD Mother   . Heart disease Father     Past Surgical History:  Procedure Laterality Date  . ABDOMINAL HYSTERECTOMY    . ANKLE FUSION Right 04/08/2017   Tibiocalcaneal Fusion with Screws to Stabilize Talar Neck Fracture/notes 04/08/2017  . ANKLE FUSION Right 04/08/2017   Procedure: Right Tibiocalcaneal Fusion with Screws to Stabilize Talar Neck Fracture;  Surgeon: Newt Minion, MD;  Location: [facility];  Service: Orthopedics;  Laterality: Right;  . ANTERIOR CERVICAL DECOMP/DISCECTOMY FUSION  2000  . BACK SURGERY    . CLOSED REDUCTION ANKLE FRACTURE Right 03/17/2016  . COLONOSCOPY    . DILATION AND  CURETTAGE OF UTERUS    . FRACTURE SURGERY    . LAPAROSCOPIC CHOLECYSTECTOMY    . LUMBAR DISC SURGERY  ~ 1988  . POSTERIOR LUMBAR FUSION  ~ 1990   "took bone from my hip"  . POSTERIOR LUMBAR FUSION  ~ 2002   "put cages in"  . SPINAL CORD STIMULATOR IMPLANT  ~ 2004  . TUBAL LIGATION    . WISDOM TOOTH EXTRACTION     Social History   Occupational History  . Not on file.   Social History Main Topics  . Smoking status: Never Smoker  . Smokeless tobacco: Never Used  . Alcohol use No  . Drug use: No  . Sexual activity: No

## 2017-07-07 ENCOUNTER — Ambulatory Visit (INDEPENDENT_AMBULATORY_CARE_PROVIDER_SITE_OTHER): Payer: Medicare HMO | Admitting: Orthopedic Surgery

## 2017-07-07 ENCOUNTER — Ambulatory Visit (INDEPENDENT_AMBULATORY_CARE_PROVIDER_SITE_OTHER): Payer: Medicare HMO

## 2017-07-07 DIAGNOSIS — Z981 Arthrodesis status: Secondary | ICD-10-CM
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
## 2017-08-06 IMAGING — DX DG ANKLE 2V *R*
2 series · 2 of 2 positions shown · non-contrast
Comparison: Pre reduction right ankle radiographs obtained this
same date.

CLINICAL DATA: Post reduction images.

EXAM:
RIGHT ANKLE - 2 VIEW

[ankle ap]
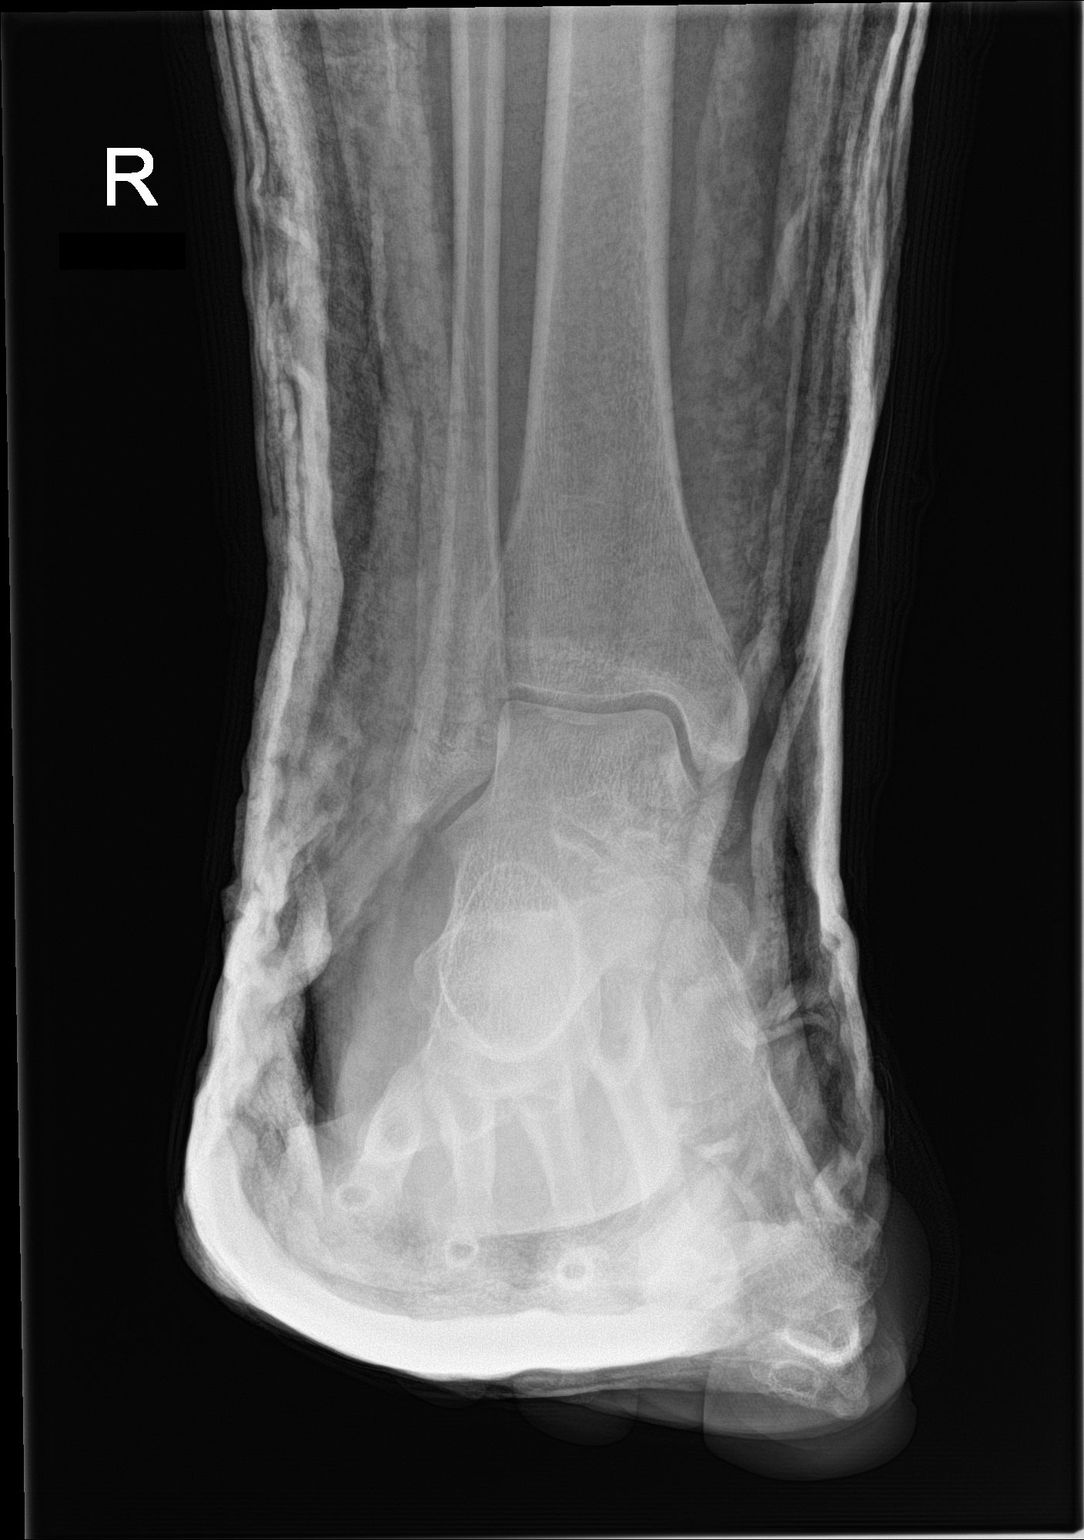

[ankle obl]
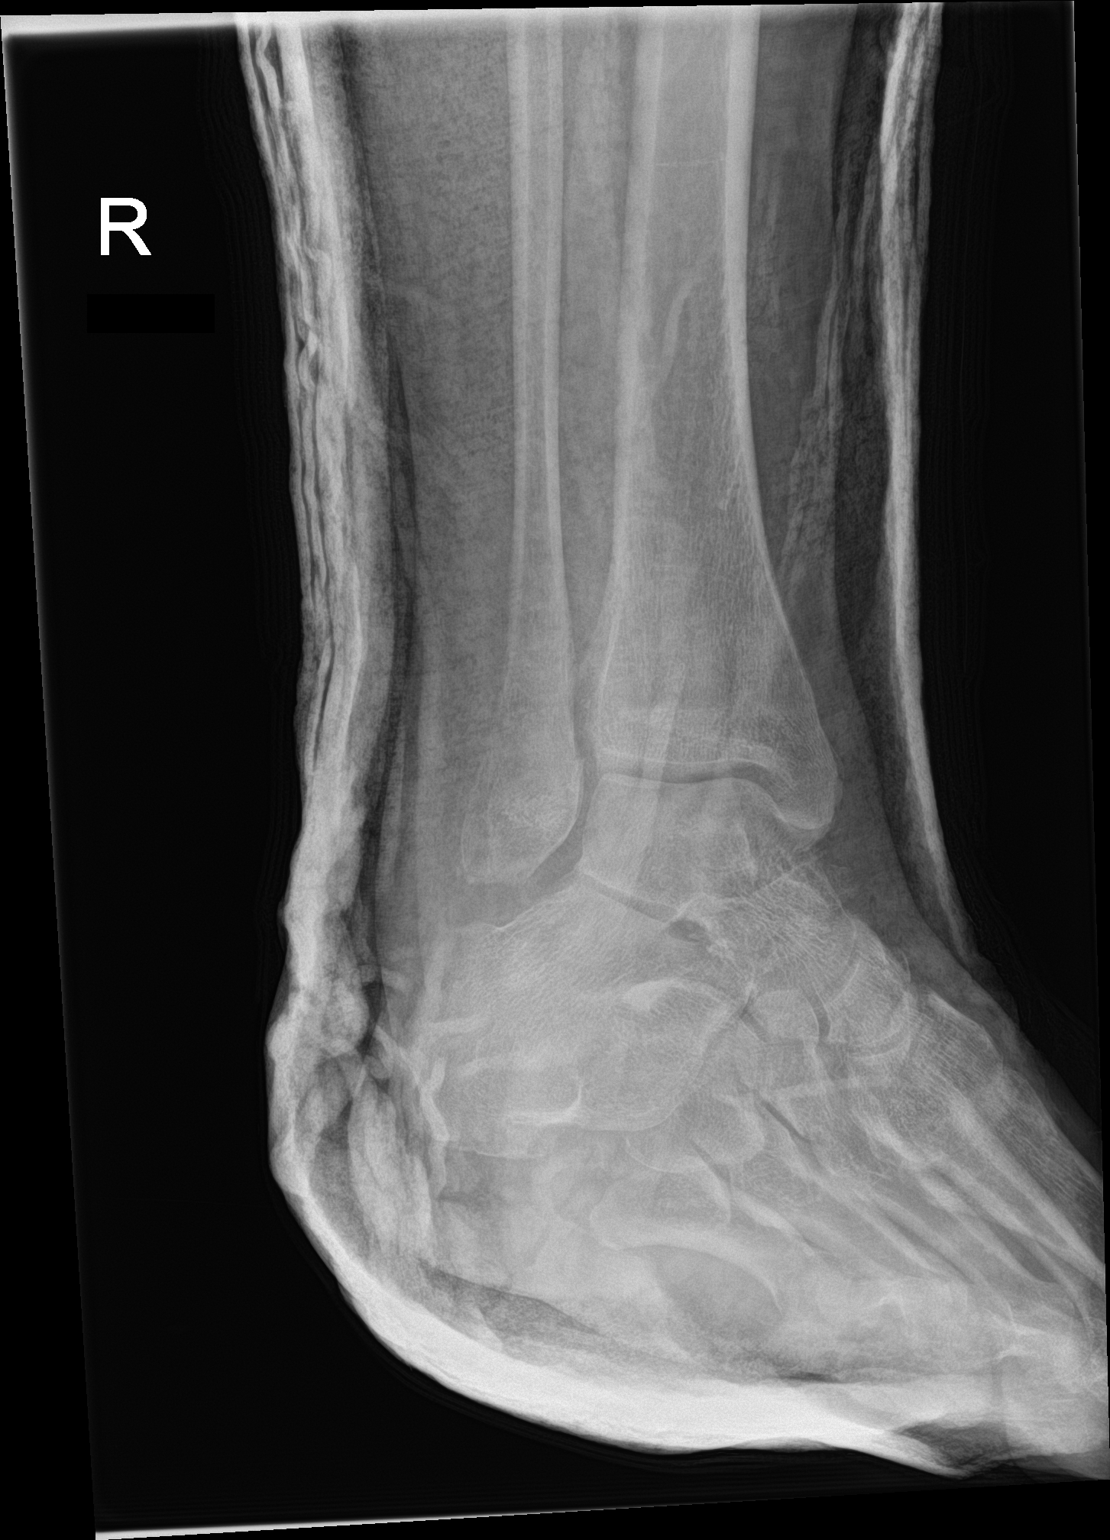

[2 of 2 positions shown; findings below may reference images not displayed]

FINDINGS: The [place] fracture has been reduced, now in normal alignment with
the tibia and fibula. The foot and ankle are supported by a
posterior plaster splint.
IMPRESSION: Well aligned ankle joint following reduction of the [place] fracture.

## 2017-08-06 IMAGING — DX DG FOOT COMPLETE 3+V*R*
3 series · 3 of 3 positions shown · non-contrast
Comparison: CT 03/17/2016

CLINICAL DATA: Postreduction

EXAM:
RIGHT FOOT COMPLETE - 3+ VIEW

[foot ap]
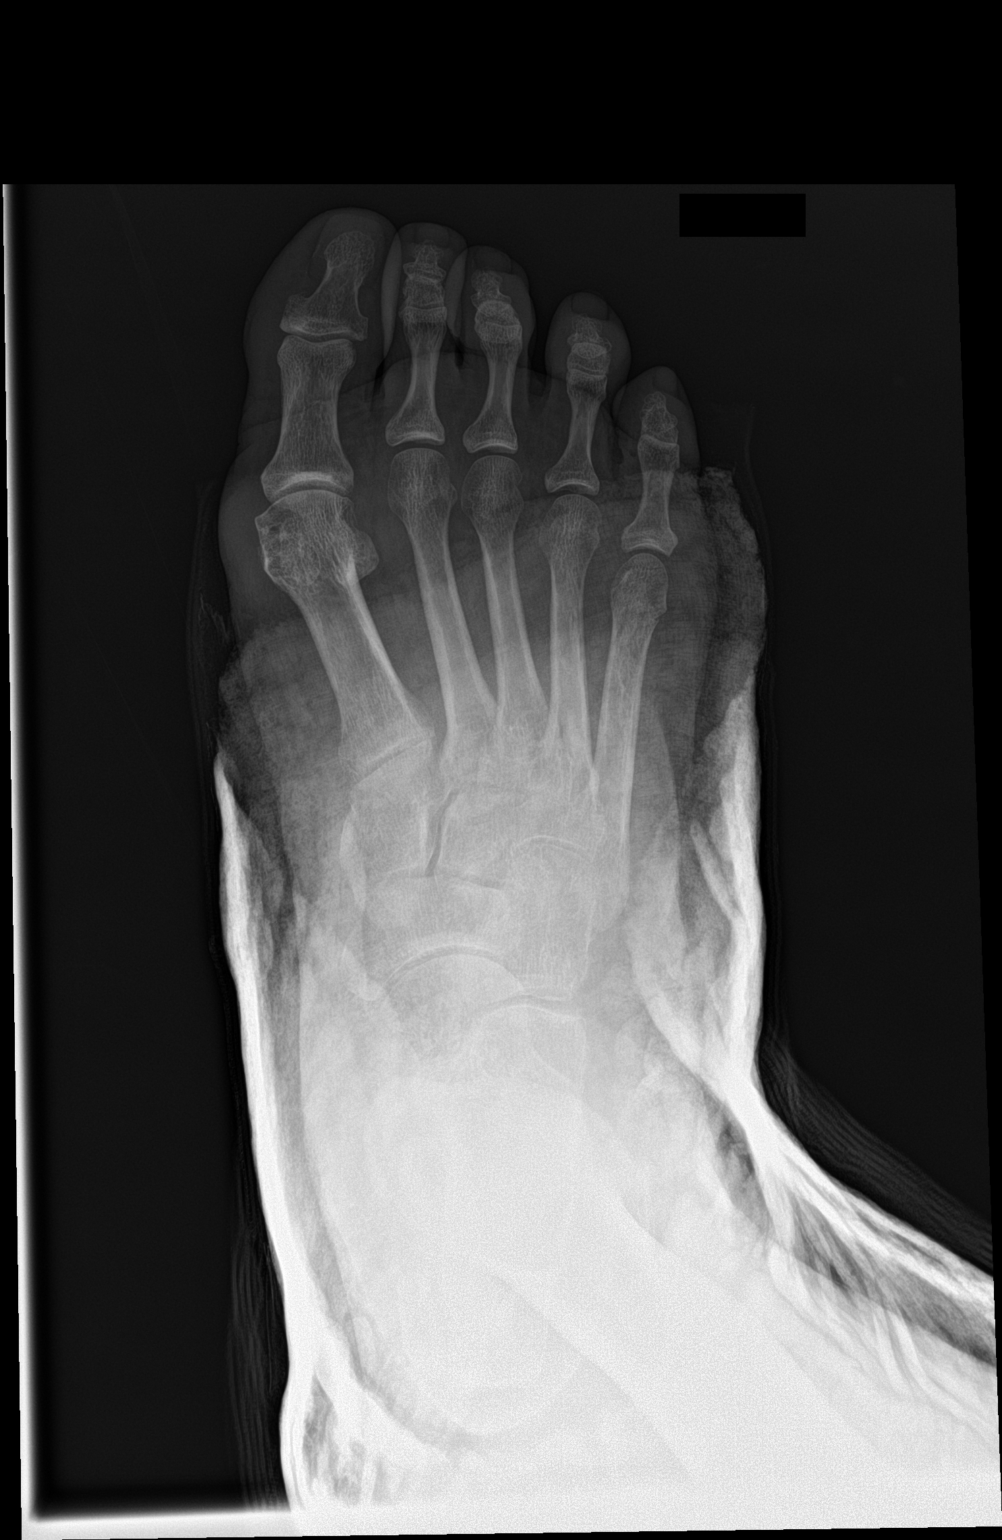

[foot lat]
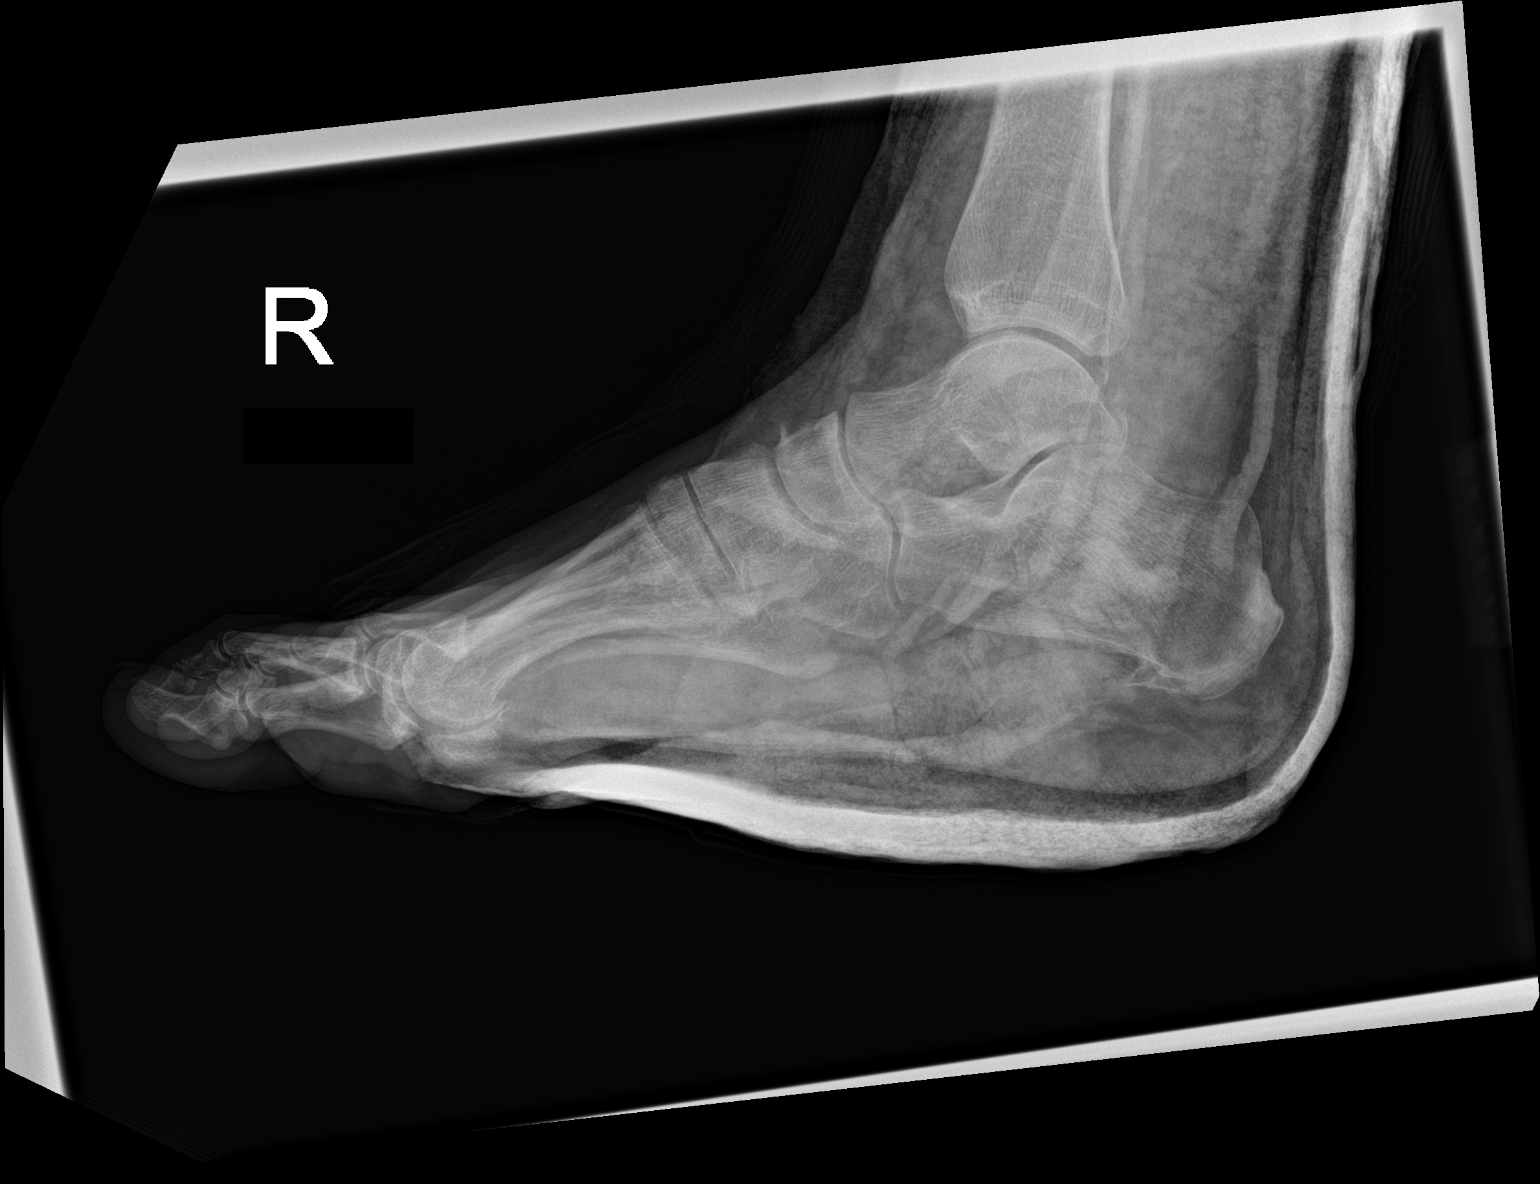

[foot obl]
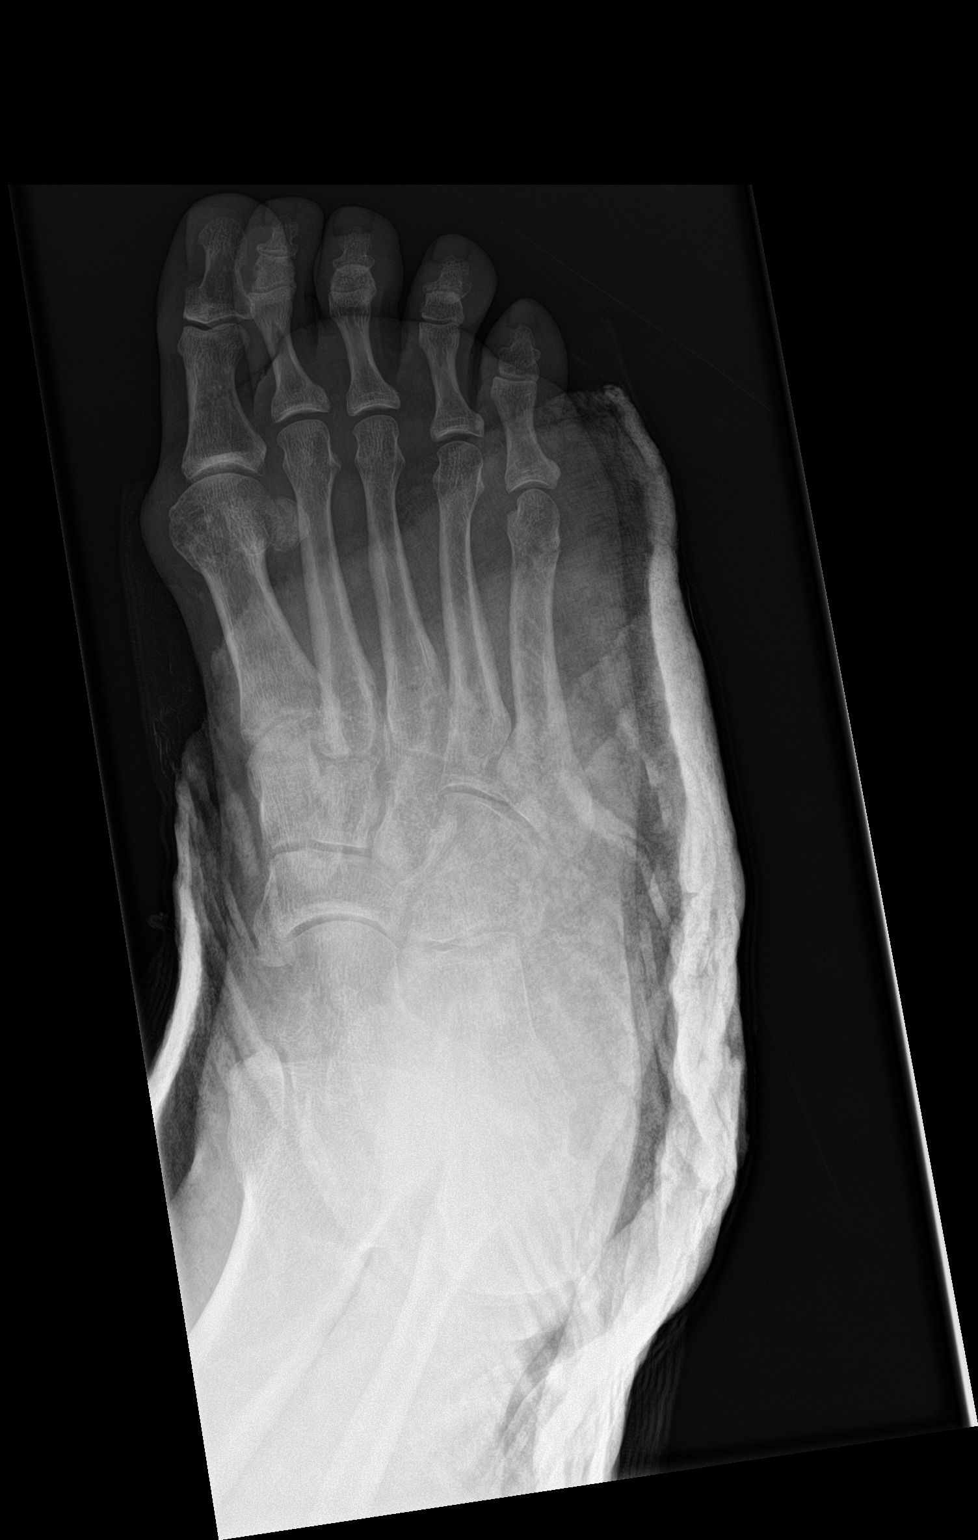

[3 of 3 positions shown; findings below may reference images not displayed]

FINDINGS: In cast views of the right foot demonstrate apparent reduction of
the previously seen dislocated tibiotalar joint and subtalar joint.
The previously seen talar fractures are not as well visualized by
plain film. No additional acute bony abnormality.
IMPRESSION: Interval reduction. Previously seen talar fractures not as well
visualized.

## 2017-08-08 ENCOUNTER — Ambulatory Visit (INDEPENDENT_AMBULATORY_CARE_PROVIDER_SITE_OTHER): Payer: Medicare HMO | Admitting: Orthopedic Surgery

## 2017-08-08 ENCOUNTER — Ambulatory Visit (INDEPENDENT_AMBULATORY_CARE_PROVIDER_SITE_OTHER): Payer: Medicare HMO

## 2017-08-08 ENCOUNTER — Encounter (INDEPENDENT_AMBULATORY_CARE_PROVIDER_SITE_OTHER): Payer: Self-pay | Admitting: Orthopedic Surgery

## 2017-08-08 DIAGNOSIS — M545 Low back pain, unspecified: Secondary | ICD-10-CM

## 2017-08-08 DIAGNOSIS — M65311 Trigger thumb, right thumb: Secondary | ICD-10-CM | POA: Diagnosis not present

## 2017-08-08 DIAGNOSIS — Z981 Arthrodesis status: Secondary | ICD-10-CM
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
## 2017-09-05 ENCOUNTER — Ambulatory Visit (INDEPENDENT_AMBULATORY_CARE_PROVIDER_SITE_OTHER): Payer: Medicare HMO | Admitting: Orthopedic Surgery

## 2017-09-14 ENCOUNTER — Encounter (INDEPENDENT_AMBULATORY_CARE_PROVIDER_SITE_OTHER): Payer: Self-pay | Admitting: Orthopedic Surgery

## 2017-09-14 ENCOUNTER — Ambulatory Visit (INDEPENDENT_AMBULATORY_CARE_PROVIDER_SITE_OTHER): Payer: Medicare HMO | Admitting: Orthopedic Surgery

## 2017-09-14 ENCOUNTER — Ambulatory Visit (INDEPENDENT_AMBULATORY_CARE_PROVIDER_SITE_OTHER): Payer: Medicare HMO

## 2017-09-14 VITALS — Ht 61.0 in | Wt 184.0 lb

## 2017-09-14 DIAGNOSIS — M25571 Pain in right ankle and joints of right foot: Secondary | ICD-10-CM

## 2017-09-14 DIAGNOSIS — M87071 Idiopathic aseptic necrosis of right ankle: Secondary | ICD-10-CM

## 2017-09-14 NOTE — Progress Notes (Signed)
Office Visit Note   Patient: Priscilla Higgins           Date of Birth: 11-Jun-1961           MRN: [id number] Visit Date: 09/14/2017              Requested by: Emelda Fear, DO [place] [address] PCP: Emelda Fear, DO  Chief Complaint  Patient presents with  . Right Ankle - Follow-up    04/08/17 right tibial calcaneal fusion       HPI: Patient is a 56 year old woman who presents 5 months status post talar collapse with tibial calcaneal fusion.  Patient states she is able to ambulate without her fracture boot she states she is mostly symptomatic at this time.  Patient states she still does have some pain with activities of daily living.  Assessment & Plan: Visit Diagnoses:  1. Pain in right ankle and joints of right foot   2. Avascular necrosis of right talus [facility])     Plan: Patient is released at this time without restrictions.  I recommended proceeding with an extra-depth shoe with double upright braces.  Patient states she does not want to consider bracing she will wear her regular sneakers.  Patient may increase her activities as tolerated specifically walking.  Follow-Up Instructions: Return if symptoms worsen or fail to improve.   Ortho Exam  Patient is alert, oriented, no adenopathy, well-dressed, normal affect, normal respiratory effort. Examination patient's foot is plantigrade her foot is well aligned there is no redness no cellulitis no swelling the incision is well-healed.  She has no instability with standing.  Imaging: No results found. No images are attached to the encounter.  Labs: No results found for: HGBA1C, ESRSEDRATE, CRP, LABURIC, REPTSTATUS, GRAMSTAIN, CULT, LABORGA  @LABSALLVALUES (HGBA1)@  Body mass index is 34.77 kg/m.  Orders:  Orders Placed This Encounter  Procedures  . XR Ankle Complete Right   No orders of the defined types were placed in this encounter.    Procedures: No procedures performed  Clinical Data: No  additional findings.  ROS:  All other systems negative, except as noted in the HPI. Review of Systems  Objective: Vital Signs: Ht 5\' 1"  (1.549 m)   Wt 184 lb (83.5 kg)   LMP  (LMP Unknown)   BMI 34.77 kg/m   Specialty Comments:  No specialty comments available.  PMFS History: Patient Active Problem List   Diagnosis Date Noted  . Status post ankle fusion 04/08/2017  . Avascular necrosis of right talus ([place])   . 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 Dislocation of right subtalar joint 03/17/2016  . 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 Anxiety   . Arthritis    "all over" (04/08/2017)  . Avascular necrosis ([facility])    right talus  . Chronic back pain   . Depressed   . Follicular lymphoma ([place])   . GERD (gastroesophageal reflux disease)   . Heart murmur   . History of blood transfusion    "related to back OR"  . Hypertension   . PONV (postoperative nausea and vomiting)   . Tibia fracture 03/17/2016    Family History  Problem Relation Age of Onset  . Heart disease Mother   . COPD Mother   . Heart disease Father     Past Surgical History:  Procedure Laterality Date  . ABDOMINAL HYSTERECTOMY    .  ANKLE FUSION Right 04/08/2017   Tibiocalcaneal Fusion with Screws to Stabilize Talar Neck Fracture/notes 04/08/2017  . ANKLE FUSION Right 04/08/2017   Procedure: Right Tibiocalcaneal Fusion with Screws to Stabilize Talar Neck Fracture;  Surgeon: Newt Minion, MD;  Location: [facility];  Service: Orthopedics;  Laterality: Right;  . ANTERIOR CERVICAL DECOMP/DISCECTOMY FUSION  2000  . BACK SURGERY    . CLOSED REDUCTION ANKLE FRACTURE Right 03/17/2016  . COLONOSCOPY    . DILATION AND CURETTAGE OF UTERUS    . FRACTURE SURGERY    . LAPAROSCOPIC CHOLECYSTECTOMY    . LUMBAR DISC SURGERY  ~ 1988  . POSTERIOR LUMBAR FUSION  ~ 1990   "took bone from my hip"  .  POSTERIOR LUMBAR FUSION  ~ 2002   "put cages in"  . SPINAL CORD STIMULATOR IMPLANT  ~ 2004  . TUBAL LIGATION    . WISDOM TOOTH EXTRACTION     Social History   Occupational History  . Not on file  Tobacco Use  . Smoking status: Never Smoker  . Smokeless tobacco: Never Used  Substance and Sexual Activity  . Alcohol use: No  . Drug use: No  . Sexual activity: No

## 2017-09-23 ENCOUNTER — Telehealth (INDEPENDENT_AMBULATORY_CARE_PROVIDER_SITE_OTHER): Payer: Self-pay | Admitting: Radiology

## 2017-09-23 ENCOUNTER — Telehealth (INDEPENDENT_AMBULATORY_CARE_PROVIDER_SITE_OTHER): Payer: Self-pay | Admitting: Orthopedic Surgery

## 2017-09-23 NOTE — Telephone Encounter (Signed)
Error

## 2017-09-23 NOTE — Telephone Encounter (Signed)
-  Patient called stating her foot is continuing to hurt. She is trying to walk long distances and just can't. It hurts too badly. She would like to know what else she can do?  I called patient and advised that one of Dr. Jess Barters recommendations at last visit was the extra depth shoes with upright braces. She states that she will have to think about that. I explained Dr. Sharol Given is not in clinic this afternoon, but I would send the message to him to see if there is anything else that he could recommend. I advised it would be some time next week before she would receive a call back.  CB for patient: [phone] or [phone]

## 2017-09-26 NOTE — Telephone Encounter (Signed)
Let us see her in the office this week.  Maybe we could try an ASO to help with her symptoms rather than the brace that she does not want to wear.

## 2017-09-28 NOTE — Telephone Encounter (Signed)
I called and spoke with patient advised she come in the office to see if ASO would be beneficial appointment made with MD at 8am.

## 2017-09-29 ENCOUNTER — Telehealth (INDEPENDENT_AMBULATORY_CARE_PROVIDER_SITE_OTHER): Payer: Self-pay

## 2017-09-29 NOTE — Telephone Encounter (Signed)
FYI- Patient wanted to let Dr. Sharol Given know she is walking a little bit better.

## 2017-09-30 ENCOUNTER — Ambulatory Visit (INDEPENDENT_AMBULATORY_CARE_PROVIDER_SITE_OTHER): Payer: Medicare HMO | Admitting: Orthopedic Surgery

## 2017-09-30 NOTE — Telephone Encounter (Signed)
Dr. Duda is aware.  

## 2018-08-09 ENCOUNTER — Encounter

## 2018-08-10 ENCOUNTER — Inpatient Hospital Stay: Admit: 2018-08-10 | Payer: BLUE CROSS/BLUE SHIELD | Attending: Family | Primary: Family

## 2018-08-10 DIAGNOSIS — Z1231 Encounter for screening mammogram for malignant neoplasm of breast: Secondary | ICD-10-CM

## 2018-08-14 ENCOUNTER — Encounter

## 2018-08-16 ENCOUNTER — Inpatient Hospital Stay: Payer: BLUE CROSS/BLUE SHIELD | Attending: Obstetrics & Gynecology | Primary: Family

## 2018-08-16 ENCOUNTER — Inpatient Hospital Stay: Admit: 2018-08-16 | Payer: BLUE CROSS/BLUE SHIELD | Attending: Obstetrics & Gynecology | Primary: Family

## 2018-08-16 DIAGNOSIS — R928 Other abnormal and inconclusive findings on diagnostic imaging of breast: Secondary | ICD-10-CM

## 2019-07-30 ENCOUNTER — Encounter

## 2021-06-18 ENCOUNTER — Ambulatory Visit: Admit: 2021-06-18 | Attending: Nurse Practitioner | Primary: Family

## 2021-06-18 DIAGNOSIS — J301 Allergic rhinitis due to pollen: Secondary | ICD-10-CM

## 2021-06-18 MED ORDER — PSEUDOEPH-BROMPHEN-DM 30-2-10 MG/5ML PO SYRP
2-30-10 MG/5ML | Freq: Four times a day (QID) | ORAL | 0 refills | Status: AC | PRN
Start: 2021-06-18 — End: ?

## 2021-06-18 NOTE — Progress Notes (Signed)
PROGRESS NOTE    SUBJECTIVE:   Pamela Hale is a 60 y.o. female seen for cough. Pt complains of coryza, congestion, sneezing, post nasal drip, and dry cough for 2 days. She denies a history of chest pain, chills, dizziness, fatigue, fevers, myalgias, nausea, shortness of breath, sweats, vomiting, weakness, weight loss, and sputum production and does not a history of asthma. Patient does not smoke cigarettes.      Cough  This is a new problem. The current episode started yesterday. The problem has been unchanged. The problem occurs hourly. The cough is Non-productive. Associated symptoms include ear congestion, nasal congestion, postnasal drip and rhinorrhea. Pertinent negatives include no chills, fever, headaches, hemoptysis, myalgias, sore throat, shortness of breath, sweats, weight loss or wheezing. The symptoms are aggravated by pollens. She has tried nothing for the symptoms. Her past medical history is significant for environmental allergies.     Current Outpatient Medications   Medication Sig Dispense Refill    brompheniramine-pseudoephedrine-DM (BROMFED DM) 2-30-10 MG/5ML syrup Take 5 mLs by mouth 4 times daily as needed for Congestion or Cough 118 mL 0    Multiple Vitamins-Minerals (MULTIVITAMIN ADULT EXTRA C PO) Take 1 tablet by mouth daily      traZODone (DESYREL) 50 MG tablet        No current facility-administered medications for this visit.     Allergies   Allergen Reactions    Sulfa Antibiotics Other (See Comments)    Codeine Nausea And Vomiting     Social History     Tobacco Use    Smoking status: Never    Smokeless tobacco: Never   Substance Use Topics    Alcohol use: Yes     Alcohol/week: 0.8 standard drinks       Review of Systems   Constitutional:  Negative for appetite change, chills, diaphoresis, fatigue, fever and weight loss.   HENT:  Positive for congestion, postnasal drip and rhinorrhea. Negative for sore throat.    Respiratory:  Positive for cough. Negative for hemoptysis, shortness of  breath and wheezing.    Musculoskeletal:  Negative for myalgias.   Allergic/Immunologic: Positive for environmental allergies.   Neurological:  Negative for headaches.        OBJECTIVE:  Pulse 73    Resp 16    SpO2 97%      Physical Exam  Constitutional:       General: She is awake.      Appearance: Normal appearance. She is well-developed and well-groomed.   HENT:      Head: Normocephalic and atraumatic.      Right Ear: Hearing, tympanic membrane, ear canal and external ear normal.      Left Ear: Hearing, ear canal and external ear normal. A middle ear effusion is present.      Nose: Congestion and rhinorrhea present. Rhinorrhea is clear.      Right Turbinates: Swollen.      Left Turbinates: Swollen.      Right Sinus: No maxillary sinus tenderness or frontal sinus tenderness.      Left Sinus: No maxillary sinus tenderness or frontal sinus tenderness.      Mouth/Throat:      Lips: Pink.      Mouth: Mucous membranes are moist.      Pharynx: Oropharynx is clear. Uvula midline.   Cardiovascular:      Rate and Rhythm: Normal rate and regular rhythm.      Heart sounds: S1 normal and S2 normal.  Pulmonary:      Effort: Pulmonary effort is normal.      Breath sounds: Normal breath sounds and air entry.   Lymphadenopathy:      Head:      Right side of head: No submandibular, preauricular or posterior auricular adenopathy.      Left side of head: No submandibular, preauricular or posterior auricular adenopathy.   Neurological:      Mental Status: She is alert.   Psychiatric:         Behavior: Behavior is cooperative.       ASSESSMENT and PLAN    Pamela Hale was seen today for cough.    Diagnoses and all orders for this visit:    Seasonal allergic rhinitis due to pollen  -     brompheniramine-pseudoephedrine-DM (BROMFED DM) 2-30-10 MG/5ML syrup; Take 5 mLs by mouth 4 times daily as needed for Congestion or Cough    Allergic cough  -     brompheniramine-pseudoephedrine-DM (BROMFED DM) 2-30-10 MG/5ML syrup; Take 5 mLs by mouth 4  times daily as needed for Congestion or Cough    Fluid level behind tympanic membrane of left ear    Start Flonase OTC . Start Zyrtec OTC .  Increase fluid intake.     Patient Education:  Discussed need to avoid/limit exposure to allergens. Change air filter monthly. Wear mask while doing yard work. Keep pets off bed during bedtime hours. Vacuum carpet weekly. Leave windows closed. Use Nasal spray after showering.     Follow in 2 weeks with PCP or here if symptoms not any better or worse.    Patient voices understanding and agrees with the plan of care described above.     Counseled on benefits of having a primary care provider which includes, but is not limited to, continuity of care and having a medical home when concerns arise. Also enforced that onsite clinic policy states that we are not to take the place of a primary care provider, pt verbalized understanding.     SEs and risk vs benefits associated with medications prescribed discussed with patient who verbalized understanding. Pt verbalized understanding and agreement with plan of care. RTC for persisting/worsening symptoms or new complaints that arise. Discussed signs and symptoms that would warrant immediate evaluation including, but not limited to HA, blurred vision, speech disturbance, difficulty with ambulation/gait, numbness, tingling, weakness, syncope, chest pain, or shortness of breath.    I have reviewed the patient's medication list, past medical, family, social, and surgical history in detail and updated the patient record appropriately.    Pamela Gales, APRN - NP

## 2021-06-29 ENCOUNTER — Ambulatory Visit: Admit: 2021-06-29 | Attending: Nurse Practitioner | Primary: Family

## 2021-06-29 DIAGNOSIS — B009 Herpesviral infection, unspecified: Secondary | ICD-10-CM

## 2021-06-29 MED ORDER — VALACYCLOVIR HCL 1 G PO TABS
1 g | ORAL_TABLET | Freq: Two times a day (BID) | ORAL | 0 refills | Status: AC
Start: 2021-06-29 — End: 2021-06-30

## 2021-06-29 NOTE — Progress Notes (Signed)
PROGRESS NOTE    SUBJECTIVE:   Pamela Hale is a 60 y.o. female with 10 day(s) history of ear pressure and popping of left ear, and coryza, congestion, and post nasal drip. Temperature normal at home. Pt was diagnosed with URI over two weeks ago that required antibiotics as well as steroids. Pt states that is resolved now. Pt states she does not usually have seasonal allergies but has been using her Flonase she was prescribed. Pt also reports new cold sore to her right upper lip.    Otalgia   There is pain in the left ear. This is a new problem. The current episode started 1 to 4 weeks ago. The problem occurs constantly. The problem has been unchanged. There has been no fever. The pain is at a severity of 0/10. The patient is experiencing no pain. Associated symptoms include hearing loss and rhinorrhea. Pertinent negatives include no abdominal pain, coughing, diarrhea, ear discharge, headaches, neck pain, rash, sore throat or vomiting. She has tried antibiotics and acetaminophen for the symptoms. The treatment provided mild relief. There is no history of a chronic ear infection, hearing loss or a tympanostomy tube.     Current Outpatient Medications   Medication Sig Dispense Refill    fluticasone (FLONASE) 50 MCG/ACT nasal spray 2 sprays by Nasal route daily      metroNIDAZOLE (METROCREAM) 0.75 % cream       valACYclovir (VALTREX) 1 g tablet Take 2 tablets by mouth 2 times daily for 1 day 4 tablet 0    Multiple Vitamins-Minerals (MULTIVITAMIN ADULT EXTRA C PO) Take 1 tablet by mouth daily      traZODone (DESYREL) 50 MG tablet       brompheniramine-pseudoephedrine-DM (BROMFED DM) 2-30-10 MG/5ML syrup Take 5 mLs by mouth 4 times daily as needed for Congestion or Cough 118 mL 0     No current facility-administered medications for this visit.     Allergies   Allergen Reactions    Sulfa Antibiotics Other (See Comments) and Itching    Codeine Nausea And Vomiting     Social History     Tobacco Use    Smoking status:  Never    Smokeless tobacco: Never   Substance Use Topics    Alcohol use: Yes     Alcohol/week: 0.8 standard drinks       Review of Systems   Constitutional:  Negative for activity change, appetite change, chills, diaphoresis, fatigue, fever and unexpected weight change.   HENT:  Positive for congestion, ear pain, hearing loss, postnasal drip, rhinorrhea and tinnitus. Negative for ear discharge, facial swelling, mouth sores, nosebleeds, sinus pressure, sinus pain, sneezing, sore throat, trouble swallowing and voice change.    Respiratory:  Negative for cough.    Gastrointestinal:  Negative for abdominal pain, diarrhea and vomiting.   Musculoskeletal:  Negative for neck pain.   Skin:  Negative for rash.   Neurological:  Negative for headaches.        OBJECTIVE:  There were no vitals taken for this visit.     Physical Exam  Constitutional:       General: She is awake.      Appearance: Normal appearance. She is well-developed and well-groomed.   HENT:      Head: Normocephalic and atraumatic.      Jaw: There is normal jaw occlusion.      Right Ear: Hearing, ear canal and external ear normal. A middle ear effusion is present.  Left Ear: Ear canal and external ear normal. Decreased hearing noted. A middle ear effusion is present.      Nose: Nose normal.      Right Turbinates: Swollen.      Left Turbinates: Swollen.      Right Sinus: No maxillary sinus tenderness or frontal sinus tenderness.      Left Sinus: No maxillary sinus tenderness or frontal sinus tenderness.      Mouth/Throat:      Lips: Pink.      Mouth: Mucous membranes are moist.      Tongue: No lesions. Tongue does not deviate from midline.      Palate: No mass and lesions.      Pharynx: Oropharynx is clear. Uvula midline.      Tonsils: No tonsillar exudate or tonsillar abscesses.   Cardiovascular:      Rate and Rhythm: Normal rate and regular rhythm.      Heart sounds: S1 normal and S2 normal.   Pulmonary:      Effort: Pulmonary effort is normal.       Breath sounds: Normal breath sounds and air entry.   Lymphadenopathy:      Head:      Right side of head: No submandibular, preauricular or posterior auricular adenopathy.      Left side of head: No submandibular, preauricular or posterior auricular adenopathy.   Neurological:      Mental Status: She is alert.   Psychiatric:         Behavior: Behavior is cooperative.       ASSESSMENT and PLAN    Diagnoses and all orders for this visit:    Herpes simplex  -     valACYclovir (VALTREX) 1 g tablet; Take 2 tablets by mouth 2 times daily for 1 day    Fluid level behind tympanic membrane of both ears    Start Zyrtec . Continue Flonase .  Increase fluid intake.     Patient Education:  Discussed need to avoid/limit exposure to allergens. Change air filter monthly. Wear mask while doing yard work. Keep pets off bed during bedtime hours. Vacuum carpet weekly. Leave windows closed. Use Nasal spray after showering.     Follow in 2 weeks with PCP or here if symptoms not Pamela better or worse.    Patient voices understanding and agrees with the plan of care described above.     Counseled on benefits of having a primary care provider which includes, but is not limited to, continuity of care and having a medical home when concerns arise. Also enforced that onsite clinic policy states that we are not to take the place of a primary care provider, pt verbalized understanding.     SEs and risk vs benefits associated with medications prescribed discussed with patient who verbalized understanding. Pt verbalized understanding and agreement with plan of care. RTC for persisting/worsening symptoms or new complaints that arise. Discussed signs and symptoms that would warrant immediate evaluation including, but not limited to HA, blurred vision, speech disturbance, difficulty with ambulation/gait, numbness, tingling, weakness, syncope, chest pain, or shortness of breath.    I have reviewed the patient's medication list, past medical, family,  social, and surgical history in detail and updated the patient record appropriately.    Darlys Gales, APRN - NP

## 2021-07-06 ENCOUNTER — Ambulatory Visit: Admit: 2021-07-06 | Attending: Nurse Practitioner | Primary: Family

## 2021-07-06 DIAGNOSIS — J069 Acute upper respiratory infection, unspecified: Secondary | ICD-10-CM

## 2021-07-06 NOTE — Progress Notes (Signed)
PROGRESS NOTE    SUBJECTIVE:   Pamela Hale is a 60 y.o. female who is seen for evaluation of sore throat. Associated symptoms include hoarseness, nasal blockage, pain while swallowing, post nasal drip, sinus and nasal congestion, sore throat, and swollen glands.Onset of symptoms was 5 days ago, unchanged since that time. She  has not had recent close exposure to someone with proven streptococcal pharyngitis.  Pharyngitis  This is a new problem. The current episode started in the past 7 days. The problem occurs daily. The problem has been unchanged. Associated symptoms include congestion, a sore throat and swollen glands. Pertinent negatives include no abdominal pain, anorexia, arthralgias, change in bowel habit, chest pain, chills, coughing, diaphoresis, fatigue, fever, headaches, joint swelling, myalgias, nausea, neck pain, numbness, rash, urinary symptoms, vertigo, visual change, vomiting or weakness. Exacerbated by: Laying down at night. She has tried heat and drinking (salt water gargles and chloraseptic spray) for the symptoms. The treatment provided mild relief.     Current Outpatient Medications   Medication Sig Dispense Refill    fluticasone (FLONASE) 50 MCG/ACT nasal spray 2 sprays by Nasal route daily      metroNIDAZOLE (METROCREAM) 0.75 % cream       Multiple Vitamins-Minerals (MULTIVITAMIN ADULT EXTRA C PO) Take 1 tablet by mouth daily      traZODone (DESYREL) 50 MG tablet       brompheniramine-pseudoephedrine-DM (BROMFED DM) 2-30-10 MG/5ML syrup Take 5 mLs by mouth 4 times daily as needed for Congestion or Cough 118 mL 0     No current facility-administered medications for this visit.     Allergies   Allergen Reactions    Sulfa Antibiotics Other (See Comments) and Itching    Codeine Nausea And Vomiting     Social History     Tobacco Use    Smoking status: Never    Smokeless tobacco: Never   Substance Use Topics    Alcohol use: Yes     Alcohol/week: 0.8 standard drinks       Review of Systems    Constitutional:  Negative for activity change, appetite change, chills, diaphoresis, fatigue, fever and unexpected weight change.   HENT:  Positive for congestion, postnasal drip, rhinorrhea, sore throat and tinnitus. Negative for dental problem, drooling, ear discharge, ear pain, facial swelling, hearing loss, mouth sores, nosebleeds, sinus pressure, sinus pain, sneezing, trouble swallowing and voice change.    Respiratory:  Negative for apnea, cough, choking, chest tightness, shortness of breath, wheezing and stridor.    Cardiovascular:  Negative for chest pain.   Gastrointestinal:  Negative for abdominal pain, anorexia, change in bowel habit, nausea and vomiting.   Musculoskeletal:  Negative for arthralgias, joint swelling, myalgias and neck pain.   Skin:  Negative for rash.   Neurological:  Negative for vertigo, weakness, numbness and headaches.        OBJECTIVE:  There were no vitals taken for this visit.     Physical Exam  Constitutional:       Appearance: Normal appearance. She is normal weight.   HENT:      Head: Normocephalic and atraumatic.      Right Ear: Tympanic membrane, ear canal and external ear normal.      Left Ear: Tympanic membrane, ear canal and external ear normal.      Nose: Nose normal.      Mouth/Throat:      Mouth: Mucous membranes are moist.      Pharynx: Oropharynx is clear. No posterior  oropharyngeal erythema.   Cardiovascular:      Rate and Rhythm: Normal rate and regular rhythm.   Pulmonary:      Effort: Pulmonary effort is normal.      Breath sounds: Normal breath sounds.   Lymphadenopathy:      Head:      Right side of head: Submandibular adenopathy present. No preauricular or posterior auricular adenopathy.      Left side of head: Submandibular adenopathy present. No preauricular or posterior auricular adenopathy.   Neurological:      Mental Status: She is alert.       ASSESSMENT and PLAN    Pamela Hale was seen today for pharyngitis.    Diagnoses and all orders for this visit:    Viral  upper respiratory tract infection    Seasonal allergic rhinitis due to pollen    Continue Zyrtec and flonase.  Increase fluid intake. Continue salt water gargles and chloraseptic for sore throat.     Patient Education:  Discussed need to avoid/limit exposure to allergens. Change air filter monthly. Wear mask while doing yard work. Keep pets off bed during bedtime hours. Vacuum carpet weekly. Leave windows closed. Use Nasal spray after showering.     Follow in 2 weeks with PCP or here if symptoms not any better or worse.    Patient voices understanding and agrees with the plan of care described above.    Counseled on benefits of having a primary care provider which includes, but is not limited to, continuity of care and having a medical home when concerns arise. Also enforced that onsite clinic policy states that we are not to take the place of a primary care provider, pt verbalized understanding.     SEs and risk vs benefits associated with medications prescribed discussed with patient who verbalized understanding. Pt verbalized understanding and agreement with plan of care. RTC for persisting/worsening symptoms or new complaints that arise. Discussed signs and symptoms that would warrant immediate evaluation including, but not limited to HA, blurred vision, speech disturbance, difficulty with ambulation/gait, numbness, tingling, weakness, syncope, chest pain, or shortness of breath.    I have reviewed the patient's medication list, past medical, family, social, and surgical history in detail and updated the patient record appropriately.    Darlys Gales, APRN - NP

## 2021-07-15 ENCOUNTER — Ambulatory Visit: Admit: 2021-07-15 | Attending: Nurse Practitioner | Primary: Family

## 2021-07-15 DIAGNOSIS — E119 Type 2 diabetes mellitus without complications: Secondary | ICD-10-CM

## 2021-07-15 MED ORDER — METFORMIN HCL 500 MG PO TABS
500 MG | ORAL_TABLET | Freq: Every day | ORAL | 0 refills | Status: AC
Start: 2021-07-15 — End: ?

## 2021-07-15 NOTE — Progress Notes (Signed)
PROGRESS NOTE    SUBJECTIVE:   Pamela Hale is a 60 y.o. female seen for lab results from results. Pt's HDL 34 and Hgb A1C of 6.8. Pt's previous Hgb A1C was 6.3 in October of last year.   Diabetes  She presents for her initial diabetic visit. She has type 2 diabetes mellitus. No MedicAlert identification noted. The initial diagnosis of diabetes was made 1 week ago. There are no hypoglycemic associated symptoms. Associated symptoms include polyuria. Pertinent negatives for diabetes include no blurred vision, no chest pain, no fatigue, no foot paresthesias, no foot ulcerations, no polydipsia, no polyphagia, no visual change, no weakness and no weight loss. There are no hypoglycemic complications. There are no diabetic complications. Risk factors for coronary artery disease include post-menopausal and stress. Current diabetic treatment includes oral agent (monotherapy).     Current Outpatient Medications   Medication Sig Dispense Refill    metFORMIN (GLUCOPHAGE) 500 MG tablet Take 1 tablet by mouth daily (with breakfast) 30 tablet 0    fluticasone (FLONASE) 50 MCG/ACT nasal spray 2 sprays by Nasal route daily      metroNIDAZOLE (METROCREAM) 0.75 % cream       Multiple Vitamins-Minerals (MULTIVITAMIN ADULT EXTRA C PO) Take 1 tablet by mouth daily      traZODone (DESYREL) 50 MG tablet       brompheniramine-pseudoephedrine-DM (BROMFED DM) 2-30-10 MG/5ML syrup Take 5 mLs by mouth 4 times daily as needed for Congestion or Cough 118 mL 0     No current facility-administered medications for this visit.     Allergies   Allergen Reactions    Sulfa Antibiotics Other (See Comments) and Itching    Codeine Nausea And Vomiting     Social History     Tobacco Use    Smoking status: Never    Smokeless tobacco: Never   Substance Use Topics    Alcohol use: Yes     Alcohol/week: 0.8 standard drinks       Review of Systems   Constitutional:  Negative for fatigue and weight loss.   Eyes:  Negative for blurred vision.   Cardiovascular:   Negative for chest pain.   Endocrine: Positive for polyuria. Negative for polydipsia and polyphagia.   Neurological:  Negative for weakness.        OBJECTIVE:  There were no vitals taken for this visit.     Physical Exam  Constitutional:       General: She is awake.      Appearance: Normal appearance. She is well-developed and overweight.   Neurological:      Mental Status: She is alert.   Psychiatric:         Behavior: Behavior is cooperative.       ASSESSMENT and PLAN    Diagnoses and all orders for this visit:    Type 2 diabetes mellitus without complication, without long-term current use of insulin (HCC)  -     metFORMIN (GLUCOPHAGE) 500 MG tablet; Take 1 tablet by mouth daily (with breakfast)    Diabetes education provided today:    Diabetes pathoetiology, difference between type 1 and type 2 diabetes, and progressive nature of Type 2 DM.  Diabetic Neuropathy: signs and therapy.  Foot care: advised to wash feet daily, pat dry and apply lotion at night, avoiding between toes. Need to look at feet daily and report to a physician any signs of inflammation or skin damage. Discussed diabetes shoes and socks.  Nutrition as a mainstream of  diabetes therapy. Learned about label reading.  Carbs: good carbs and bad carbs, importance of carb counting, incorporation of protein with each meal to reduce Glycemic index, importance of portions, Carb/insulin ratio.  Fats: Good fats and bad fats, meal planning and supplements.  Physical activity: advised to exercise 5-7 days a week 30-60 mins at least. Discussed how it affects BS readings.  Different diabetic medications.  Pt to RTC in 1 month to assess blood sugar control and possibly increase metformin dose if needed.      Counseled on benefits of having a primary care provider which includes, but is not limited to, continuity of care and having a medical home when concerns arise. Also enforced that onsite clinic policy states that we are not to take the place of a primary  care provider, pt verbalized understanding.     SEs and risk vs benefits associated with medications prescribed discussed with patient who verbalized understanding. Pt verbalized understanding and agreement with plan of care. RTC for persisting/worsening symptoms or new complaints that arise. Discussed signs and symptoms that would warrant immediate evaluation including, but not limited to HA, blurred vision, speech disturbance, difficulty with ambulation/gait, numbness, tingling, weakness, syncope, chest pain, or shortness of breath.    I have reviewed the patient's medication list, past medical, family, social, and surgical history in detail and updated the patient record appropriately.    Darlys Gales, APRN - NP

## 2021-08-10 ENCOUNTER — Encounter

## 2022-04-28 ENCOUNTER — Telehealth: Payer: Self-pay | Admitting: Radiation Oncology

## 2022-04-28 NOTE — Telephone Encounter (Signed)
Request for DICOM records received from [facility] and given to dosimetry team.
